# Patient Record
Sex: Male | Born: 1960 | Race: White | Hispanic: No | Marital: Married | Smoking: Former smoker
Health system: Southern US, Community
[De-identification: ages and names within clinical notes are randomized; demographics above are authoritative.]

## PROBLEM LIST (undated history)

## (undated) DIAGNOSIS — L80 Vitiligo: Secondary | ICD-10-CM

## (undated) DIAGNOSIS — M109 Gout, unspecified: Secondary | ICD-10-CM

## (undated) DIAGNOSIS — L309 Dermatitis, unspecified: Secondary | ICD-10-CM

## (undated) DIAGNOSIS — K5792 Diverticulitis of intestine, part unspecified, without perforation or abscess without bleeding: Secondary | ICD-10-CM

## (undated) DIAGNOSIS — I10 Essential (primary) hypertension: Secondary | ICD-10-CM

## (undated) HISTORY — DX: Diverticulitis of intestine, part unspecified, without perforation or abscess without bleeding: K57.92

## (undated) HISTORY — DX: Dermatitis, unspecified: L30.9

## (undated) HISTORY — DX: Vitiligo: L80

## (undated) HISTORY — DX: Gout, unspecified: M10.9

---

## 2019-06-25 ENCOUNTER — Ambulatory Visit: Payer: Self-pay | Admitting: Family Medicine

## 2019-07-02 ENCOUNTER — Ambulatory Visit (INDEPENDENT_AMBULATORY_CARE_PROVIDER_SITE_OTHER): Payer: 59 | Admitting: Family Medicine

## 2019-07-02 ENCOUNTER — Other Ambulatory Visit: Payer: Self-pay

## 2019-07-02 ENCOUNTER — Other Ambulatory Visit: Payer: Self-pay | Admitting: Family Medicine

## 2019-07-02 ENCOUNTER — Encounter: Payer: Self-pay | Admitting: Family Medicine

## 2019-07-02 VITALS — BP 122/77 | HR 55 | Temp 97.7°F | Ht 72.0 in | Wt 174.6 lb

## 2019-07-02 DIAGNOSIS — E782 Mixed hyperlipidemia: Secondary | ICD-10-CM

## 2019-07-02 DIAGNOSIS — I10 Essential (primary) hypertension: Secondary | ICD-10-CM

## 2019-07-02 DIAGNOSIS — R0789 Other chest pain: Secondary | ICD-10-CM | POA: Diagnosis not present

## 2019-07-02 DIAGNOSIS — K219 Gastro-esophageal reflux disease without esophagitis: Secondary | ICD-10-CM | POA: Diagnosis not present

## 2019-07-02 DIAGNOSIS — Z8249 Family history of ischemic heart disease and other diseases of the circulatory system: Secondary | ICD-10-CM

## 2019-07-02 DIAGNOSIS — L309 Dermatitis, unspecified: Secondary | ICD-10-CM | POA: Insufficient documentation

## 2019-07-02 DIAGNOSIS — Z8739 Personal history of other diseases of the musculoskeletal system and connective tissue: Secondary | ICD-10-CM | POA: Diagnosis not present

## 2019-07-02 DIAGNOSIS — N529 Male erectile dysfunction, unspecified: Secondary | ICD-10-CM | POA: Insufficient documentation

## 2019-07-02 DIAGNOSIS — Z8719 Personal history of other diseases of the digestive system: Secondary | ICD-10-CM

## 2019-07-02 DIAGNOSIS — Z0000 Encounter for general adult medical examination without abnormal findings: Secondary | ICD-10-CM

## 2019-07-02 DIAGNOSIS — Z1159 Encounter for screening for other viral diseases: Secondary | ICD-10-CM

## 2019-07-02 DIAGNOSIS — Z86010 Personal history of colonic polyps: Secondary | ICD-10-CM

## 2019-07-02 DIAGNOSIS — E7800 Pure hypercholesterolemia, unspecified: Secondary | ICD-10-CM | POA: Insufficient documentation

## 2019-07-02 NOTE — Patient Instructions (Addendum)
Thank you for coming to the office today.  Voltaren OTC topical anti inflammatory medicine, can do a general rx diclofenac rx in future.  Keep track of your activity level - and functional status with positioning.  Advanced Lipid Panel w/ Inflammatory Markers  Bring copy of last colonoscopy report.  DUE for FASTING BLOOD WORK (no food or drink after midnight before the lab appointment, only water or coffee without cream/sugar on the morning of)  SCHEDULE "Lab Only" visit in the morning at the clinic for lab draw in 4 WEEKS   - Make sure Lab Only appointment is at about 1 week before your next appointment, so that results will be available  For Lab Results, once available within 2-3 days of blood draw, you can can log in to MyChart online to view your results and a brief explanation. Also, we can discuss results at next follow-up visit.   Please schedule a Follow-up Appointment to: Return in about 4 weeks (around 07/30/2019) for Annual Physical.  If you have any other questions or concerns, please feel free to call the office or send a message through [place]. You may also schedule an earlier appointment if necessary.  Additionally, you may be receiving a survey about your experience at our office within a few days to 1 week by e-mail or mail. We value your feedback.  [name], DO [place]

## 2019-07-02 NOTE — Progress Notes (Addendum)
Subjective:    Patient ID: Lucas Lopez, male    DOB: 03-Sep-1960, 59 y.o.   MRN: [id number]  Yamen Haith is a 59 y.o. male presenting on 07/02/2019 for [place] (Joint pain)  Moved from [place], [place] to [place]. Previously in [place], then moved to [place]. Now has returned back for past 2 weeks to [place].  HPI    CHRONIC HTN: Reports BP readings elevated in past, he has been keeping track of home BP readings previously, does not have detailed report today. He was on diuretic in past from prior PCP had urinary frequency issues then switched to Bystolic has done well with BP control. Also he has low pulse with exercising history of a lot of cardio exercise 40s-50s range or higher at times. He says some side effects on Bystolic assoc with decreased libido erectile dysfunction, asking about adjust med in future.  Current Meds - Bystolic 15mg  (5mg  x 3 = 15)   Reports good compliance, took meds today. Tolerating well, w/o complaints. 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 Plans to retire in 2021.Building house in [place] now to life with his wife.  Suspected Costochondritis / Ribcage vs Chest Wall Pain, Chronic Now improving. But describes chronic history of chest wall rib discomfort, seemed to be affected by running / physical activity, but he did not describe exertional chest pain or pressure, or dyspnea or other concerning features. - Prior history of cardiac work up with EKG, Exercise Stress Test in past that was negative, in [place]. - Currently since moving back to [place] and changes in routine he has not had as much pain - He has tried NSAIDs in past with some relief - Currently not using med  Hyperlipidemia Prior results elevated cholesterol  Tried statin but muscle side effects, and will defer this for now, but interested to discuss further for cardiovascular protection if indicated. - [place] fam history of Hyperlipidemia, heart disease  ED Was on Sildenafil > switched with Tadalafil, thought it could cause the heartburn  Additional history Functional GI symptoms - Never had normal bowels by his report, but improved on probiotic  Mother fam history massive MI age 68 Father's fam history affecting him about 10 yr earlier  Checked into supplements, temporarily held them Vitamin D 2,000 unit dailys Re introduced Vitamin C Probiotic daily . History of Arthritis knees - improved on glucosamine, but stopped this unsure if affecting gastritis / GERD   Health Maintenance:  Last Colonoscopy 07/2016 - Has copy, pre-cancerous few polyps removed, advised 5 year repeat, will bring copy - Had EGD - had heartburn ulcer > for past 6 months - had prior  - had x 2 h pylori test negative (blood and breath) - was on Omeprazole 40mg  daily, down to 20mg , able to limit, then restarted work and symptoms returned with stress w work.  He has upcoming EGD per [facility].   Depression screen PHQ 2/9 07/02/2019  Decreased Interest 0  Down, Depressed, Hopeless 0  PHQ - 2 Score 0    Past Medical History:  Diagnosis Date  . Diverticulitis   . Eczema   . Gout    In the left most 3 toes of left foot  . Vitiligo    History reviewed. No pertinent surgical history.  Social History   Socioeconomic History  . Marital status: Married    Spouse name: Toris Milillo  . Number of children: Not on file  . Years of education: Not on file  . Highest education level: Not on file  Occupational History  . Occupation: Scientist, forensic    Comment: (retiring in Summer 2021)  Tobacco Use  . Smoking status: Former Research scientist (life sciences)  . Smokeless tobacco: Current User  Substance and Sexual Activity  . Alcohol use: Not Currently  . Drug use: Not Currently    Types:  Marijuana  . Sexual activity: Not on file  Other Topics Concern  . Not on file  Social History Narrative  . Not on file   Social Determinants of Health   Financial Resource Strain:   . Difficulty of Paying Living Expenses:   Food Insecurity:   . Worried About Charity fundraiser in the Last Year:   . Arboriculturist in the Last Year:   Transportation Needs:   . Film/video editor (Medical):   Marland Kitchen Lack of Transportation (Non-Medical):   Physical Activity:   . Days of Exercise per Week:   . Minutes of Exercise per Session:   Stress:   . Feeling of Stress :   Social Connections:   . Frequency of Communication with Friends and Family:   . Frequency of Social Gatherings with Friends and Family:   . Attends Religious Services:   . Active Member of Clubs or Organizations:   . Attends Archivist Meetings:   Marland Kitchen Marital Status:   Intimate Partner Violence:   . Fear of Current or Ex-Partner:   . Emotionally Abused:   Marland Kitchen Physically Abused:   . Sexually Abused:    Family History  Problem Relation Age of Onset  . Hypertension Mother   . Heart attack Mother 75  . Fibroids Mother   . Pneumonia Father 65  . Hyperlipidemia Father   . Hypertension Father   . Osteoarthritis Father   . Psoriasis Sister   . Skin cancer Sister    Current Outpatient Medications on File Prior to Visit  Medication Sig  . ascorbic acid (VITAMIN C) 500 MG tablet Take 500 mg by mouth daily.  Marland Kitchen BYSTOLIC 5 MG tablet 15 mg daily.   . Cholecalciferol (VITAMIN D) 50 MCG (2000 UT) CAPS Take 2,000 Units by mouth daily.  . clotrimazole (MYCELEX) 10 MG troche Take 10 mg by mouth as needed.  . clotrimazole-betamethasone (LOTRISONE) cream Apply 1 application topically 2 (two) times daily.  . cromolyn (NASALCROM) 5.2 MG/ACT nasal spray Place 1 spray into both nostrils daily as needed.  Marland Kitchen omeprazole (PRILOSEC) 20 MG capsule Take 20 mg by mouth daily.  . sildenafil (REVATIO) 20 MG tablet Take 60 mg by mouth as  needed.   No current facility-administered medications on file prior to visit.    Review of Systems Per HPI unless specifically indicated above      Objective:    BP 122/77   Pulse (!) 55   Temp 97.7 F (36.5 C)   Ht 6' (1.829 m)   Wt 174 lb 9.6 oz (79.2 kg)   SpO2 99%   BMI 23.68 kg/m   Wt Readings from Last 3 Encounters:  07/02/19 174 lb 9.6 oz (79.2 kg)    Physical Exam Vitals and nursing note reviewed.  Constitutional:      General: He is not in acute distress.    Appearance: He is well-developed. He is  not diaphoretic.     Comments: Well-appearing, comfortable, cooperative  HENT:     Head: Normocephalic and atraumatic.  Eyes:     General:        Right eye: No discharge.        Left eye: No discharge.     Conjunctiva/sclera: Conjunctivae normal.  Neck:     Thyroid: No thyromegaly.  Cardiovascular:     Rate and Rhythm: Normal rate and regular rhythm.     Heart sounds: Normal heart sounds. No murmur.  Pulmonary:     Effort: Pulmonary effort is normal. No respiratory distress.     Breath sounds: Normal breath sounds. No wheezing or rales.  Chest:     Chest wall: Tenderness (reproducible on moderate palpation bilateral anterior chest wall over ribs R and L side, not over sternum. Also has protrusion deformity of xiphoid process area of lower sternum) present.  Musculoskeletal:        General: Normal range of motion.     Cervical back: Normal range of motion and neck supple.  Lymphadenopathy:     Cervical: No cervical adenopathy.  Skin:    General: Skin is warm and dry.     Findings: No erythema or rash.  Neurological:     Mental Status: He is alert and oriented to person, place, and time.  Psychiatric:        Behavior: Behavior normal.     Comments: Well groomed, good eye contact, normal speech and thoughts    No results found for this or any previous visit.    Assessment & Plan:   Problem List Items Addressed This Visit    Mixed hyperlipidemia    Relevant Medications   BYSTOLIC 5 MG tablet   sildenafil (REVATIO) 20 MG tablet   History of gout   History of diverticulosis   History of colon polyps   Gastroesophageal reflux disease   Relevant Medications   omeprazole (PRILOSEC) 20 MG capsule   Essential hypertension - Primary   Relevant Medications   BYSTOLIC 5 MG tablet   sildenafil (REVATIO) 20 MG tablet   Erectile dysfunction   Eczema   Costochondral pain     Review background history outside records Request copy of last colonoscopy 2018  #Hyperlipidemia No recent lab Off statin, previously on with myalgia Fam history of HLD and heart disease WIll check Adv Lipid panel w inflammation from [facility] Reconsider statin options, dose adjust  #HTN Controlled on Bystolic 15mg  daily (5mg  x3) Consider other options, avoid thiazide since intolerance on diuretic Review lab results to help determine next BP med option if indicated  #ED 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  No orders of the defined types were placed in this encounter.   Follow up plan: Return in about 4 weeks (around 07/30/2019) for Annual Physical.  Future labs order CMET CBC A1c ADV LIPID PANEL, PSA TSH HEP C - 07/14/19 FUTURE EKG  [name], DO [facility] 07/02/2019, 10:40 AM

## 2019-07-07 LAB — HM COLONOSCOPY

## 2019-07-14 ENCOUNTER — Other Ambulatory Visit: Payer: 59

## 2019-07-14 ENCOUNTER — Other Ambulatory Visit: Payer: Self-pay

## 2019-07-14 DIAGNOSIS — I10 Essential (primary) hypertension: Secondary | ICD-10-CM

## 2019-07-14 DIAGNOSIS — Z0000 Encounter for general adult medical examination without abnormal findings: Secondary | ICD-10-CM

## 2019-07-14 DIAGNOSIS — Z1159 Encounter for screening for other viral diseases: Secondary | ICD-10-CM

## 2019-07-14 DIAGNOSIS — E782 Mixed hyperlipidemia: Secondary | ICD-10-CM

## 2019-07-14 DIAGNOSIS — Z8249 Family history of ischemic heart disease and other diseases of the circulatory system: Secondary | ICD-10-CM

## 2019-07-14 MED ORDER — BYSTOLIC 5 MG PO TABS: 15.0000 mg | ORAL_TABLET | Freq: Every day | ORAL | 1 refills | Status: DC

## 2019-07-15 ENCOUNTER — Encounter: Payer: Self-pay | Admitting: Family Medicine

## 2019-07-15 DIAGNOSIS — Z86010 Personal history of colonic polyps: Secondary | ICD-10-CM

## 2019-07-25 LAB — CBC WITH DIFFERENTIAL/PLATELET
Absolute Monocytes: 419 cells/uL (ref 200–950)
Basophils Absolute: 28 cells/uL (ref 0–200)
Basophils Relative: 0.6 %
Eosinophils Absolute: 60 cells/uL (ref 15–500)
Eosinophils Relative: 1.3 %
HCT: 44.5 % (ref 38.5–50.0)
Hemoglobin: 14.7 g/dL (ref 13.2–17.1)
Lymphs Abs: 1339 cells/uL (ref 850–3900)
MCH: 31.7 pg (ref 27.0–33.0)
MCHC: 33 g/dL (ref 32.0–36.0)
MCV: 96.1 fL (ref 80.0–100.0)
MPV: 11.1 fL (ref 7.5–12.5)
Monocytes Relative: 9.1 %
Neutro Abs: 2755 cells/uL (ref 1500–7800)
Neutrophils Relative %: 59.9 %
Platelets: 174 10*3/uL (ref 140–400)
RBC: 4.63 10*6/uL (ref 4.20–5.80)
RDW: 12.6 % (ref 11.0–15.0)
Total Lymphocyte: 29.1 %
WBC: 4.6 10*3/uL (ref 3.8–10.8)

## 2019-07-25 LAB — PSA: PSA: 1.1 ng/mL (ref <=4.0)

## 2019-07-25 LAB — COMPLETE METABOLIC PANEL WITH GFR
AG Ratio: 2 (calc) (ref 1.0–2.5)
ALT: 19 U/L (ref 9–46)
AST: 23 U/L (ref 10–35)
Albumin: 4.3 g/dL (ref 3.6–5.1)
Alkaline phosphatase (APISO): 49 U/L (ref 35–144)
BUN: 15 mg/dL (ref 7–25)
CO2: 26 mmol/L (ref 20–32)
Calcium: 9.3 mg/dL (ref 8.6–10.3)
Chloride: 102 mmol/L (ref 98–110)
Creat: 0.96 mg/dL (ref 0.70–1.33)
GFR, Est African American: 101 mL/min/{1.73_m2} (ref >=60)
GFR, Est Non African American: 87 mL/min/{1.73_m2} (ref >=60)
Globulin: 2.2 g/dL (calc) (ref 1.9–3.7)
Glucose, Bld: 99 mg/dL (ref 65–99)
Potassium: 4.4 mmol/L (ref 3.5–5.3)
Sodium: 136 mmol/L (ref 135–146)
Total Bilirubin: 0.7 mg/dL (ref 0.2–1.2)
Total Protein: 6.5 g/dL (ref 6.1–8.1)

## 2019-07-25 LAB — HEPATITIS C ANTIBODY
Hepatitis C Ab: NONREACTIVE
SIGNAL TO CUT-OFF: 0.01 (ref <1.00)

## 2019-07-25 LAB — CARDIO IQ ADV LIPID AND INFLAMM PNL
Apolipoprotein B: 97 mg/dL — ABNORMAL HIGH
Cholesterol: 207 mg/dL — ABNORMAL HIGH (ref <200)
HDL: 60 mg/dL (ref >=40)
LDL Cholesterol (Calc): 126 mg/dL (calc) — ABNORMAL HIGH (ref <100)
LDL Large: 4553 nmol/L — ABNORMAL LOW (ref >6729)
LDL Medium: 254 nmol/L — ABNORMAL HIGH (ref <215)
LDL Particle Number: 1038 nmol/L (ref <1138)
LDL Peak Size: 221.7 Angstrom — ABNORMAL LOW (ref >222.9)
LDL Small: 143 nmol/L — ABNORMAL HIGH (ref <142)
Lipoprotein (a): 12 nmol/L (ref <75)
Non-HDL Cholesterol (Calc): 147 mg/dL (calc) — ABNORMAL HIGH (ref <130)
PLAC: 105 nmol/min/mL (ref <=123)
Total CHOL/HDL Ratio: 3.5 calc (ref <5.0)
Triglycerides: 107 mg/dL (ref <150)
hs-CRP: 2 mg/L

## 2019-07-25 LAB — HEMOGLOBIN A1C
Hgb A1c MFr Bld: 5.5 % of total Hgb (ref <5.7)
Mean Plasma Glucose: 111 (calc)
eAG (mmol/L): 6.2 (calc)

## 2019-07-25 LAB — TSH: TSH: 2.05 mIU/L (ref 0.40–4.50)

## 2019-08-05 ENCOUNTER — Encounter: Payer: Self-pay | Admitting: Family Medicine

## 2019-08-05 ENCOUNTER — Other Ambulatory Visit: Payer: Self-pay | Admitting: Family Medicine

## 2019-08-05 ENCOUNTER — Ambulatory Visit (INDEPENDENT_AMBULATORY_CARE_PROVIDER_SITE_OTHER): Payer: 59 | Admitting: Family Medicine

## 2019-08-05 ENCOUNTER — Other Ambulatory Visit: Payer: Self-pay

## 2019-08-05 VITALS — BP 127/82 | HR 50 | Temp 98.0°F | Ht 72.0 in | Wt 173.0 lb

## 2019-08-05 DIAGNOSIS — K2289 Other specified disease of esophagus: Secondary | ICD-10-CM

## 2019-08-05 DIAGNOSIS — E7800 Pure hypercholesterolemia, unspecified: Secondary | ICD-10-CM

## 2019-08-05 DIAGNOSIS — K219 Gastro-esophageal reflux disease without esophagitis: Secondary | ICD-10-CM | POA: Diagnosis not present

## 2019-08-05 DIAGNOSIS — I10 Essential (primary) hypertension: Secondary | ICD-10-CM

## 2019-08-05 DIAGNOSIS — Z0000 Encounter for general adult medical examination without abnormal findings: Secondary | ICD-10-CM | POA: Diagnosis not present

## 2019-08-05 DIAGNOSIS — K229 Disease of esophagus, unspecified: Secondary | ICD-10-CM | POA: Diagnosis not present

## 2019-08-05 LAB — POCT URINALYSIS DIPSTICK
Bilirubin, UA: NEGATIVE
Blood, UA: NEGATIVE
Glucose, UA: NEGATIVE
Ketones, UA: NEGATIVE
Leukocytes, UA: NEGATIVE
Nitrite, UA: NEGATIVE
Protein, UA: POSITIVE — AB
Spec Grav, UA: 1.01 (ref 1.010–1.025)
Urobilinogen, UA: 0.2 E.U./dL
pH, UA: 5 (ref 5.0–8.0)

## 2019-08-05 MED ORDER — OMEPRAZOLE 20 MG PO CPDR: 20.0000 mg | DELAYED_RELEASE_CAPSULE | Freq: Every day | ORAL | 1 refills | Status: DC

## 2019-08-05 MED ORDER — ROSUVASTATIN CALCIUM 5 MG PO TABS: 5.0000 mg | ORAL_TABLET | Freq: Every day | ORAL | 3 refills | Status: DC

## 2019-08-05 NOTE — Assessment & Plan Note (Signed)
Mild elevated LDL 126, adv lipid panel reviewed, mostly optimal to moderate risk, low inflammation Last lipid panel 07/2019 Calculated ASCVD 10 yr risk score 7.5%, has fam history MI age 72s  Plan: 1. START Rosuvastatin 5mg  nightly - review benefit risk primary ASCVD risk reduction, and lower lipids, review side effects possible and titration plan if side effect 2 Encourage improved lifestyle - low carb/cholesterol, reduce portion size, continue improving regular exercise  Check Lipid and CMET in 3 months, may do virtual to review results, otherwise yearly

## 2019-08-05 NOTE — Patient Instructions (Addendum)
Thank you for coming to the office today.  Go ahead and take the Omeprazole 20mg  daily before breakfast for period of time can do shorter interval if need 2-6 weeks can taper off to avoid rebound. In future if need longer, may use for several months 3-6 months at a time, we can be vigilant and hold off on this med in future and see how you respond off of it, and compare.  You are at increased risk of future Cardiovascular complications such as Heart Attack or Stroke from an artery blockage due to abnormal cholesterol and/or risk factors. - As discussed, Statin Cholesterol pills both can both LOWER cholesterol and REDUCE this future risk of heart attack and stroke - Start Rosuvastatin (generic Crestor) 5mg  pill once at bedtime every night  If you develop mild aches or pains in muscle or joint that does NOT improve or go away after first 3-4 weeks then this may require [place] to adjust the dose. First I would recommend STOPPING the medication for a few weeks until your ache and pain symptoms completely RESOLVE. Then you can restart at a LOWER DOSE either HALF a pill at bedtime every night or LESS OFTEN such as one pill a week only and then gradually increase to every other day or max dose of 3 times a week  Lastly, sometimes we need to try other versions of this medicine to find one that works for you and does not cause side effects.  - Possible Morton's Neuroma of foot, or other nerve compression.  DUE for FASTING BLOOD WORK (no food or drink after midnight before the lab appointment, only water or coffee without cream/sugar on the morning of)  SCHEDULE "Lab Only" visit in the morning at the clinic for lab draw in 3 MONTHS   - Make sure Lab Only appointment is at about 1 week before your next appointment, so that results will be available  For Lab Results, once available within 2-3 days of blood draw, you can can log in to MyChart online to view your results and a brief explanation. Also, we can  discuss results at next follow-up visit.    Please schedule a Follow-up Appointment to: Return in about 3 months (around 11/04/2019) for fasting lab only then can do virtual for results if prefer (Cholesterol).  If you have any other questions or concerns, please feel free to call the office or send a message through [facility] [place]. You may also schedule an earlier appointment if necessary.  Additionally, you may be receiving a survey about your experience at our office within a few days to 1 week by e-mail or mail. We value your feedback.  [name], DO [place]

## 2019-08-05 NOTE — Progress Notes (Signed)
Subjective:    Patient ID: Lucas Lopez, male    DOB: 1960/07/27, 59 y.o.   MRN: [id number]  Lucas Lopez is a 59 y.o. male presenting on 08/05/2019 for Annual Exam   HPI   Here Annual Physical and Lab Review.  Lifestyle / Hyperlipidemia / BMI >23 Overal he is doing well, balanced diet and regular exercise Labs showed A1c 5.5, he has maternal fam history T2DM age 75-60s Prior results elevated cholesterol. Lab panel now from 07/2019 showed LDL 126, total chol 207, and adv lipid panel results as well, mostly with optimal or moderate only risk, does have low small LDL particle size which is optimal but has reduced Large LDL which is moderate risk. He has low inflammatory markers. - Past history tried statin but muscle side effects unsure which one or which dose, he is willing to try again - Not on ASA - He is interested in cardiovascular prevention - [place] fam history of Hyperlipidemia, heart disease, mother massive MI age 64  Hypertension No new problem. BP controlled Taking Bystolic 15mg  daily 5mg  x 3  Additional updates Last visit discussed costchondritis or ribcage pain, and this has improved overall as he has worked on exercise regimen. Mostly resolved at this time.  Admits - Loose bowel, hemorrhoid. He takes magnesium for muscle cramps, has looser stool. Takes probiotic. Has not tried fiber supplement  GERD, without gastritis has upper GI, heartburn symptoms, was on PPI previously Followed with [facility], had EGD recently, no significant abnormality, no gastritis, only some abnormal change of cells in esophagus as described. He is asking about dosing of PPI, GI recommended to take it longer  Chronic trace proteinuria Today U/A trace protein, long term history of trace protein in urine. Never had further testing.  Health Maintenance:  Prostate CA Screening: Prior PSA / DRE reported normal. Last PSA 1.1 (07/2019). Currently asymptomatic. No known family history of prostate  CA.   Colon CA Screening: Last Colonoscopy 07/07/19 (done by [facility]), results with 1 pre-cancerous polyp benign, and internal hemorrhoid, diverticula, repeat in 5 years recommended scanned to chart. No known family history of colon CA.   Depression screen [facility] 2/9 08/05/2019 07/02/2019  Decreased Interest 0 0  Down, Depressed, Hopeless 0 0  PHQ - 2 Score 0 0    Past Medical History:  Diagnosis Date  . Diverticulitis   . Eczema   . Gout    In the left most 3 toes of left foot  . Vitiligo    History reviewed. No pertinent surgical history. Social History   Socioeconomic History  . Marital status: Married    Spouse name: Koda Quier  . Number of children: Not on file  . Years of education: Not on file  . Highest education level: Not on file  Occupational History  . Occupation: Scientist, forensic    Comment: (retiring in Summer 2021)  Tobacco Use  . Smoking status: Former Research scientist (life sciences)  . Smokeless tobacco: Former Network engineer and Sexual Activity  . Alcohol use: Not Currently  . Drug use: Not Currently    Types: Marijuana  . Sexual activity: Not on file  Other Topics Concern  . Not on file  Social History Narrative  . Not on file   Social Determinants of Health   Financial Resource Strain:   . Difficulty of Paying Living Expenses:   Food Insecurity:   . Worried About Charity fundraiser in the Last Year:   . [employer] of  Food in the Last Year:   Transportation Needs:   . Film/video editor (Medical):   Marland Kitchen Lack of Transportation (Non-Medical):   Physical Activity:   . Days of Exercise per Week:   . Minutes of Exercise per Session:   Stress:   . Feeling of Stress :   Social Connections:   . Frequency of Communication with Friends and Family:   . Frequency of Social Gatherings with Friends and Family:   . Attends Religious Services:   . Active Member of Clubs or Organizations:   . Attends Archivist Meetings:   Marland Kitchen Marital Status:   Intimate Partner  Violence:   . Fear of Current or Ex-Partner:   . Emotionally Abused:   Marland Kitchen Physically Abused:   . Sexually Abused:    Family History  Problem Relation Age of Onset  . Hypertension Mother   . Heart attack Mother 13  . Fibroids Mother   . Pneumonia Father 28  . Hyperlipidemia Father   . Hypertension Father   . Osteoarthritis Father   . Psoriasis Sister   . Skin cancer Sister    Current Outpatient Medications on File Prior to Visit  Medication Sig  . BYSTOLIC 5 MG tablet Take 3 tablets (15 mg total) by mouth daily.  . Cholecalciferol (VITAMIN D) 50 MCG (2000 UT) CAPS Take 2,000 Units by mouth daily.  . clotrimazole-betamethasone (LOTRISONE) cream Apply 1 application topically 2 (two) times daily.  . cromolyn (NASALCROM) 5.2 MG/ACT nasal spray Place 1 spray into both nostrils daily as needed.  . magnesium oxide (MAG-OX) 400 MG tablet Take 400 mg by mouth daily.  . sildenafil (REVATIO) 20 MG tablet Take 60 mg by mouth as needed.  Marland Kitchen ascorbic acid (VITAMIN C) 500 MG tablet Take 500 mg by mouth daily.   No current facility-administered medications on file prior to visit.    Review of Systems  Constitutional: Negative for activity change, appetite change, chills, diaphoresis, fatigue and fever.  HENT: Negative for congestion and hearing loss.   Eyes: Negative for visual disturbance.  Respiratory: Negative for apnea, cough, chest tightness, shortness of breath and wheezing.   Cardiovascular: Negative for chest pain, palpitations and leg swelling.  Gastrointestinal: Negative for abdominal pain, anal bleeding, blood in stool, constipation, diarrhea, nausea and vomiting.  Endocrine: Negative for cold intolerance.  Genitourinary: Negative for difficulty urinating, dysuria, frequency and hematuria.  Musculoskeletal: Negative for arthralgias, back pain and neck pain.  Skin: Negative for rash.  Allergic/Immunologic: Negative for environmental allergies.  Neurological: Negative for dizziness,  weakness, light-headedness, numbness and headaches.  Hematological: Negative for adenopathy.  Psychiatric/Behavioral: Negative for behavioral problems, dysphoric mood and sleep disturbance. The patient is not nervous/anxious.    Per HPI unless specifically indicated above      Objective:    BP 127/82   Pulse (!) 50   Temp 98 F (36.7 C) (Temporal)   Ht 6' (1.829 m)   Wt 173 lb (78.5 kg)   SpO2 99%   BMI 23.46 kg/m   Wt Readings from Last 3 Encounters:  08/05/19 173 lb (78.5 kg)  07/02/19 174 lb 9.6 oz (79.2 kg)    Physical Exam Vitals and nursing note reviewed.  Constitutional:      General: He is not in acute distress.    Appearance: He is well-developed. He is not diaphoretic.     Comments: Well-appearing, comfortable, cooperative  HENT:     Head: Normocephalic and atraumatic.  Eyes:  General:        Right eye: No discharge.        Left eye: No discharge.     Conjunctiva/sclera: Conjunctivae normal.     Pupils: Pupils are equal, round, and reactive to light.  Neck:     Thyroid: No thyromegaly.  Cardiovascular:     Rate and Rhythm: Normal rate and regular rhythm.     Heart sounds: Normal heart sounds. No murmur.  Pulmonary:     Effort: Pulmonary effort is normal. No respiratory distress.     Breath sounds: Normal breath sounds. No wheezing or rales.  Abdominal:     General: Bowel sounds are normal. There is no distension.     Palpations: Abdomen is soft. There is no mass.     Tenderness: There is no abdominal tenderness.  Musculoskeletal:        General: No tenderness. Normal range of motion.     Cervical back: Normal range of motion and neck supple.     Comments: Upper / Lower Extremities: - Normal muscle tone, strength bilateral upper extremities 5/5, lower extremities 5/5  Lymphadenopathy:     Cervical: No cervical adenopathy.  Skin:    General: Skin is warm and dry.     Findings: No erythema or rash.  Neurological:     Mental Status: He is alert and  oriented to person, place, and time.     Comments: Distal sensation intact to light touch all extremities  Psychiatric:        Behavior: Behavior normal.     Comments: Well groomed, good eye contact, normal speech and thoughts    EKG - performed in office today  Date: 08/05/19  Rate: 48 HR   Rhythm: sinus bradycardia  QRS Axis: normal  Intervals: normal  ST/T Wave abnormalities: normal  Conduction Disutrbances:none  Additional Narrative Interpretation:   Old EKG Reviewed: none available  Lipid Panel     Component Value Date/Time   CHOL 207 (H) 07/14/2019 0757   TRIG 107 07/14/2019 0757   HDL 60 07/14/2019 0757   CHOLHDL 3.5 07/14/2019 0757   LDLCALC 126 (H) 07/14/2019 0757     Chemistry      Component Value Date/Time   NA 136 07/14/2019 0757   K 4.4 07/14/2019 0757   CL 102 07/14/2019 0757   CO2 26 07/14/2019 0757   BUN 15 07/14/2019 0757   CREATININE 0.96 07/14/2019 0757      Component Value Date/Time   CALCIUM 9.3 07/14/2019 0757   AST 23 07/14/2019 0757   ALT 19 07/14/2019 0757   BILITOT 0.7 07/14/2019 0757     Recent Labs    07/14/19 0757  HGBA1C 5.5   CBC:    Component Value Date/Time   WBC 4.6 07/14/2019 0757   HGB 14.7 07/14/2019 0757   HCT 44.5 07/14/2019 0757   PLT 174 07/14/2019 0757   MCV 96.1 07/14/2019 0757   NEUTROABS 2,755 07/14/2019 0757   LYMPHSABS 1,339 07/14/2019 0757   EOSABS 60 07/14/2019 0757   BASOSABS 28 07/14/2019 0757       Results for orders placed or performed in visit on 08/05/19  POCT Urinalysis Dipstick  Result Value Ref Range   Color, UA yellow    Clarity, UA clear    Glucose, UA Negative Negative   Bilirubin, UA negative    Ketones, UA negative    Spec Grav, UA 1.010 1.010 - 1.025   Blood, UA negative    pH, UA  5.0 5.0 - 8.0   Protein, UA Positive (A) Negative   Urobilinogen, UA 0.2 0.2 or 1.0 E.U./dL   Nitrite, UA negative    Leukocytes, UA Negative Negative   Appearance clear    Odor         Assessment & Plan:   Problem List Items Addressed This Visit    Pure hypercholesterolemia    Mild elevated LDL 126, adv lipid panel reviewed, mostly optimal to moderate risk, low inflammation Last lipid panel 07/2019 Calculated ASCVD 10 yr risk score 7.5%, has fam history MI age 4s  Plan: 1. START Rosuvastatin 5mg  nightly - review benefit risk primary ASCVD risk reduction, and lower lipids, review side effects possible and titration plan if side effect 2 Encourage improved lifestyle - low carb/cholesterol, reduce portion size, continue improving regular exercise  Check Lipid and CMET in 3 months, may do virtual to review results, otherwise yearly      Relevant Medications   rosuvastatin (CRESTOR) 5 MG tablet   Mucosal abnormality of esophagus  No significant concern Identified on EGD per [facility] Agree with trial on PPI for his GERD symptoms, can monitor this in several years to determine if significant   Relevant Medications   omeprazole (PRILOSEC) 20 MG capsule   Gastroesophageal reflux disease  Trial PPI Omeprazole 20mg  daily before breakfast 2-6 week intervals can do longer if need, review safety, goals and dosing titration    Relevant Medications   magnesium oxide (MAG-OX) 400 MG tablet   omeprazole (PRILOSEC) 20 MG capsule   Essential hypertension    Well-controlled HTN - Home BP readings normal  No known complications   Plan:  1. Continue current BP regimen - Bystolic 5mg  x 3 = 15mg  2. Encourage improved lifestyle - low sodium diet, regular exercise 3. Continue monitor BP outside office, bring readings to next visit, if persistently >140/90 or new symptoms notify office sooner      Relevant Medications   rosuvastatin (CRESTOR) 5 MG tablet    Other Visit Diagnoses    Annual physical exam    -  Primary   Relevant Orders   EKG 12-Lead   POCT Urinalysis Dipstick (Completed)       Updated Health Maintenance information Reviewed recent lab results with  patient - EKG performed, unremarkable, reassuring, can monitor surveillance in future - UA performed negative except trace protein, discussed, normal Kidney function and lab chemistry unlikely to be a concern at this time can do further evaluation in future if indicated. This is a chronic problem reportedly present for 30+ years. Encouraged improvement to lifestyle with diet and exercise Maintain healthy weight  Meds ordered this encounter  Medications  . omeprazole (PRILOSEC) 20 MG capsule    Sig: Take 1 capsule (20 mg total) by mouth daily before breakfast.    Dispense:  90 capsule    Refill:  1  . rosuvastatin (CRESTOR) 5 MG tablet    Sig: Take 1 tablet (5 mg total) by mouth at bedtime.    Dispense:  90 tablet    Refill:  3    Follow up plan: Return in about 3 months (around 11/04/2019) for fasting lab only then can do virtual for results if prefer (Cholesterol).    [name], [facility] 08/05/2019, 9:36 AM

## 2019-08-05 NOTE — Assessment & Plan Note (Signed)
Well-controlled HTN - Home BP readings normal  No known complications   Plan:  1. Continue current BP regimen - Bystolic 5mg  x 3 = 15mg  2. Encourage improved lifestyle - low sodium diet, regular exercise 3. Continue monitor BP outside office, bring readings to next visit, if persistently >140/90 or new symptoms notify office sooner

## 2019-09-29 DIAGNOSIS — N529 Male erectile dysfunction, unspecified: Secondary | ICD-10-CM

## 2019-09-30 MED ORDER — TADALAFIL 5 MG PO TABS: 5.0000 mg | ORAL_TABLET | Freq: Every day | ORAL | 3 refills | Status: DC

## 2019-09-30 NOTE — Addendum Note (Signed)
Addended by: Olin Hauser on: 09/30/2019 05:26 PM   Modules accepted: Orders

## 2019-10-25 ENCOUNTER — Other Ambulatory Visit: Payer: Self-pay

## 2019-10-25 DIAGNOSIS — E7800 Pure hypercholesterolemia, unspecified: Secondary | ICD-10-CM

## 2019-10-29 ENCOUNTER — Other Ambulatory Visit: Payer: Self-pay

## 2019-10-29 ENCOUNTER — Other Ambulatory Visit: Payer: 59

## 2019-10-29 LAB — COMPLETE METABOLIC PANEL WITH GFR
AG Ratio: 2.6 (calc) — ABNORMAL HIGH (ref 1.0–2.5)
ALT: 39 U/L (ref 9–46)
AST: 23 U/L (ref 10–35)
Albumin: 5 g/dL (ref 3.6–5.1)
Alkaline phosphatase (APISO): 49 U/L (ref 35–144)
BUN: 14 mg/dL (ref 7–25)
CO2: 26 mmol/L (ref 20–32)
Calcium: 9.8 mg/dL (ref 8.6–10.3)
Chloride: 103 mmol/L (ref 98–110)
Creat: 1 mg/dL (ref 0.70–1.33)
GFR, Est African American: 96 mL/min/{1.73_m2} (ref >=60)
GFR, Est Non African American: 83 mL/min/{1.73_m2} (ref >=60)
Globulin: 1.9 g/dL (calc) (ref 1.9–3.7)
Glucose, Bld: 99 mg/dL (ref 65–99)
Potassium: 4.8 mmol/L (ref 3.5–5.3)
Sodium: 136 mmol/L (ref 135–146)
Total Bilirubin: 0.8 mg/dL (ref 0.2–1.2)
Total Protein: 6.9 g/dL (ref 6.1–8.1)

## 2019-10-29 LAB — LIPID PANEL
Cholesterol: 177 mg/dL (ref <200)
HDL: 72 mg/dL (ref >=40)
LDL Cholesterol (Calc): 88 mg/dL (calc)
Non-HDL Cholesterol (Calc): 105 mg/dL (calc) (ref <130)
Total CHOL/HDL Ratio: 2.5 (calc) (ref <5.0)
Triglycerides: 84 mg/dL (ref <150)

## 2019-11-05 ENCOUNTER — Other Ambulatory Visit: Payer: Self-pay

## 2019-11-05 ENCOUNTER — Encounter: Payer: Self-pay | Admitting: Family Medicine

## 2019-11-05 ENCOUNTER — Telehealth (INDEPENDENT_AMBULATORY_CARE_PROVIDER_SITE_OTHER): Payer: 59 | Admitting: Family Medicine

## 2019-11-05 DIAGNOSIS — E7800 Pure hypercholesterolemia, unspecified: Secondary | ICD-10-CM | POA: Diagnosis not present

## 2019-11-05 NOTE — Progress Notes (Signed)
Virtual Visit via Telephone The purpose of this virtual visit is to provide medical care while limiting exposure to the novel coronavirus (COVID19) for both patient and office staff.  Consent was obtained for phone visit:  Yes.   Answered questions that patient had about telehealth interaction:  Yes.   I discussed the limitations, risks, security and privacy concerns of performing an evaluation and management service by telephone. I also discussed with the patient that there may be a patient responsible charge related to this service. The patient expressed understanding and agreed to proceed.  Patient Location: Home Provider Location: [facility])  ---------------------------------------------------------------------- Chief Complaint  Patient presents with  . Hyperlipidemia    S: Reviewed CMA documentation. I have called patient and gathered additional HPI as follows:   Hyperlipidemia - Last visit with me 07/2019, for annual physical, treated with new start statin therapy Rosuvastatin after adv lipid panel results for ASCVD risk and LDL reduction, see prior notes for background information. - Interval update with significant improvement, on statin. See lipid results below, reduced LDL by 40 patients 126 to 88 - Today patient reports doing well. No side effects of myalgia or concerns. He is taking statin nightly. - Not on ASA - Known fam history of Hyperlipidemia, heart disease, mother massive MI age 39   Denies any known or suspected exposure to person with or possibly with COVID19.  Denies any fevers, chills, sweats, body ache, cough, shortness of breath, sinus pain or pressure, headache, abdominal pain, diarrhea  Past Medical History:  Diagnosis Date  . Diverticulitis   . Eczema   . Gout    In the left most 3 toes of left foot  . Vitiligo    Social History   Tobacco Use  . Smoking status: Former Research scientist (life sciences)  . Smokeless tobacco: Former Network engineer  Use Topics  . Alcohol use: Not Currently  . Drug use: Not Currently    Types: Marijuana    Current Outpatient Medications:  .  ascorbic acid (VITAMIN C) 500 MG tablet, Take 500 mg by mouth daily., Disp: , Rfl:  .  BYSTOLIC 5 MG tablet, Take 3 tablets (15 mg total) by mouth daily., Disp: 270 tablet, Rfl: 1 .  Cholecalciferol (VITAMIN D) 50 MCG (2000 UT) CAPS, Take 2,000 Units by mouth daily., Disp: , Rfl:  .  clotrimazole-betamethasone (LOTRISONE) cream, Apply 1 application topically 2 (two) times daily., Disp: , Rfl:  .  cromolyn (NASALCROM) 5.2 MG/ACT nasal spray, Place 1 spray into both nostrils daily as needed., Disp: , Rfl:  .  magnesium oxide (MAG-OX) 400 MG tablet, Take 400 mg by mouth daily., Disp: , Rfl:  .  omeprazole (PRILOSEC) 20 MG capsule, Take 1 capsule (20 mg total) by mouth daily before breakfast., Disp: 90 capsule, Rfl: 1 .  rosuvastatin (CRESTOR) 5 MG tablet, Take 1 tablet (5 mg total) by mouth at bedtime., Disp: 90 tablet, Rfl: 3 .  sildenafil (REVATIO) 20 MG tablet, Take 60 mg by mouth as needed., Disp: , Rfl:  .  tadalafil (CIALIS) 5 MG tablet, Take 1 tablet (5 mg total) by mouth daily. (Patient not taking: Reported on 11/05/2019), Disp: 90 tablet, Rfl: 3  Depression screen [facility] 2/9 11/05/2019 08/05/2019 07/02/2019  Decreased Interest 0 0 0  Down, Depressed, Hopeless 0 0 0  PHQ - 2 Score 0 0 0    No flowsheet data found.  -------------------------------------------------------------------------- O: No physical exam performed due to remote telephone encounter.  Lab results  reviewed.  Recent Results (from the past 2160 hour(s))  Lipid panel     Status: None   Collection Time: 10/29/19  8:48 AM  Result Value Ref Range   Cholesterol 177 <200 mg/dL   HDL 72 > OR = 40 mg/dL   Triglycerides 84 <150 mg/dL   LDL Cholesterol (Calc) 88 mg/dL (calc)    Comment: Reference range: <100 . Desirable range <100 mg/dL for primary prevention;   <70 mg/dL for patients with CHD or  diabetic patients  with > or = 2 CHD risk factors. Marland Kitchen LDL-C is now calculated using the Martin-Hopkins  calculation, which is a validated novel method providing  better accuracy than the Friedewald equation in the  estimation of LDL-C.  Cresenciano Genre et al. Annamaria Helling. 4580;998(33): 2061-2068  (http://education.[employer].com/faq/FAQ164)    Total CHOL/HDL Ratio 2.5 <5.0 (calc)   Non-HDL Cholesterol (Calc) 105 <130 mg/dL (calc)    Comment: For patients with diabetes plus 1 major ASCVD risk  factor, treating to a non-HDL-C goal of <100 mg/dL  (LDL-C of <70 mg/dL) is considered a therapeutic  option.   COMPLETE METABOLIC PANEL WITH GFR     Status: Abnormal   Collection Time: 10/29/19  8:48 AM  Result Value Ref Range   Glucose, Bld 99 65 - 99 mg/dL    Comment: .            Fasting reference interval .    BUN 14 7 - 25 mg/dL   Creat 1.00 0.70 - 1.33 mg/dL    Comment: For patients >25 years of age, the reference limit for Creatinine is approximately 13% higher for people identified as African-American. .    GFR, Est Non African American 83 > OR = 60 mL/min/1.2m2   GFR, Est African American 96 > OR = 60 mL/min/1.13m2   BUN/Creatinine Ratio NOT APPLICABLE 6 - 22 (calc)   Sodium 136 135 - 146 mmol/L   Potassium 4.8 3.5 - 5.3 mmol/L   Chloride 103 98 - 110 mmol/L   CO2 26 20 - 32 mmol/L   Calcium 9.8 8.6 - 10.3 mg/dL   Total Protein 6.9 6.1 - 8.1 g/dL   Albumin 5.0 3.6 - 5.1 g/dL   Globulin 1.9 1.9 - 3.7 g/dL (calc)   AG Ratio 2.6 (H) 1.0 - 2.5 (calc)   Total Bilirubin 0.8 0.2 - 1.2 mg/dL   Alkaline phosphatase (APISO) 49 35 - 144 U/L   AST 23 10 - 35 U/L   ALT 39 9 - 46 U/L    -------------------------------------------------------------------------- A&P:  Problem List Items Addressed This Visit    Pure hypercholesterolemia - Primary    Significant improvement on new statin Reduced LDL by about 40 patients from 126 to 88 No side effect and normal LFTs, only slight elevated  ALT still normal range  Prior adv lipid panel done, see prior results Calculated ASCVD 10 yr risk score 7.5%, has fam history MI age 18s  Plan: 1. CONTINUE Rosuvastatin 5mg  nightly - review benefit risk primary ASCVD risk reduction, and lower lipids, review side effects possible and titration plan if side effect 2 Encourage improved lifestyle - low carb/cholesterol, reduce portion size, continue improving regular exercise         No orders of the defined types were placed in this encounter.   Follow-up: - Return in April 2022 for physical, will be due for UA and EKG at time of visit  He will request [facility] or other labs prior to his visit.  Patient verbalizes understanding with the above medical recommendations including the limitation of remote medical advice.  Specific follow-up and call-back criteria were given for patient to follow-up or seek medical care more urgently if needed.   - Time spent in direct consultation with patient on phone: 10 minutes   [name], [facility] 11/05/2019, 8:41 AM

## 2019-11-05 NOTE — Assessment & Plan Note (Signed)
Significant improvement on new statin Reduced LDL by about 40 patients from 126 to 88 No side effect and normal LFTs, only slight elevated ALT still normal range  Prior adv lipid panel done, see prior results Calculated ASCVD 10 yr risk score 7.5%, has fam history MI age 81s  Plan: 1. CONTINUE Rosuvastatin 5mg  nightly - review benefit risk primary ASCVD risk reduction, and lower lipids, review side effects possible and titration plan if side effect 2 Encourage improved lifestyle - low carb/cholesterol, reduce portion size, continue improving regular exercise

## 2019-11-05 NOTE — Patient Instructions (Addendum)
  Keep up the great work  Keep on Rosuvastatin 5mg  nightly, it is working great based on the lab results!  DUE for FASTING BLOOD WORK (no food or drink after midnight before the lab appointment, only water or coffee without cream/sugar on the morning of)  DUE APRIL 2022  LET ME KNOW WHICH LAB COMPANY ([place], [place], [facility]) - if you want to go to [place] if you want a stand alone [facility] office - there should be one nearby in [place].  - Make sure Lab Only appointment is at about 1 week before your next appointment, so that results will be available  For Lab Results, once available within 2-3 days of blood draw, you can can log in to MyChart online to view your results and a brief explanation. Also, we can discuss results at next follow-up visit.   Please schedule a Follow-up Appointment to: Return in about 9 months (around 08/05/2020) for Annual physical + UA, EKG.  If you have any other questions or concerns, please feel free to call the office or send a message through [place]. You may also schedule an earlier appointment if necessary.  Additionally, you may be receiving a survey about your experience at our office within a few days to 1 week by e-mail or mail. We value your feedback.  [name], DO [facility]

## 2019-12-25 DIAGNOSIS — I10 Essential (primary) hypertension: Secondary | ICD-10-CM

## 2019-12-25 DIAGNOSIS — E7800 Pure hypercholesterolemia, unspecified: Secondary | ICD-10-CM

## 2020-01-04 MED ORDER — ROSUVASTATIN CALCIUM 5 MG PO TABS: 5.0000 mg | ORAL_TABLET | Freq: Every day | ORAL | 3 refills | Status: DC

## 2020-01-04 MED ORDER — BYSTOLIC 5 MG PO TABS: 15.0000 mg | ORAL_TABLET | Freq: Every day | ORAL | 3 refills | Status: DC

## 2020-01-04 NOTE — Addendum Note (Signed)
Addended by: Olin Hauser on: 01/04/2020 12:49 PM   Modules accepted: Orders

## 2020-01-07 ENCOUNTER — Telehealth: Payer: Self-pay | Admitting: Family Medicine

## 2020-01-07 DIAGNOSIS — E7800 Pure hypercholesterolemia, unspecified: Secondary | ICD-10-CM

## 2020-01-07 DIAGNOSIS — I10 Essential (primary) hypertension: Secondary | ICD-10-CM

## 2020-01-07 NOTE — Telephone Encounter (Signed)
Refill send on 01/04/2020.

## 2020-01-10 ENCOUNTER — Encounter: Payer: Self-pay | Admitting: Family Medicine

## 2020-01-10 ENCOUNTER — Other Ambulatory Visit: Payer: Self-pay

## 2020-01-10 ENCOUNTER — Ambulatory Visit (INDEPENDENT_AMBULATORY_CARE_PROVIDER_SITE_OTHER): Payer: 59 | Admitting: Family Medicine

## 2020-01-10 VITALS — BP 122/66 | HR 64 | Temp 97.7°F | Resp 16 | Ht 72.0 in | Wt 174.0 lb

## 2020-01-10 DIAGNOSIS — Z23 Encounter for immunization: Secondary | ICD-10-CM

## 2020-01-10 DIAGNOSIS — M545 Low back pain, unspecified: Secondary | ICD-10-CM | POA: Diagnosis not present

## 2020-01-10 DIAGNOSIS — M62830 Muscle spasm of back: Secondary | ICD-10-CM | POA: Diagnosis not present

## 2020-01-10 DIAGNOSIS — M47812 Spondylosis without myelopathy or radiculopathy, cervical region: Secondary | ICD-10-CM | POA: Diagnosis not present

## 2020-01-10 MED ORDER — NAPROXEN 500 MG PO TABS: 500.0000 mg | ORAL_TABLET | Freq: Two times a day (BID) | ORAL | 0 refills | Status: DC

## 2020-01-10 MED ORDER — BACLOFEN 10 MG PO TABS: 5.0000 mg | ORAL_TABLET | Freq: Three times a day (TID) | ORAL | 0 refills | Status: DC | PRN

## 2020-01-10 NOTE — Patient Instructions (Addendum)
Thank you for coming to the office today.  1. For your Back Pain - I think that this is due to Muscle Spasms or strain. 2. Start with anti-inflammatory Naprosyn (Naproxen) 500mg  twice daily (12 hrs apart, with food, breakfast and dinner) every day for next 1-2 weeks if helping, then can use only as needed 3. Start Baclofen (Lioresal) 10mg  tablets - cut in half for 5mg  at night for muscle relaxant - may make you sedated or sleepy (be careful driving or working on this) if tolerated you can take every 8 hours, half or whole tab 4. May use Tylenol Extra Str 500mg  tabs - may take 1-2 tablets every 6 hours as needed 5. Recommend to start using heating pad on your lower back 1-2x daily for few weeks  Also try a Wedge Seat Cushion to avoid nerve pinching when sitting prolonged period of time.  This pain may take weeks to months to fully resolve, but hopefully it will respond to the medicine initially. All back injuries (small or serious) are slow to heal since we use our back muscles every day. Be careful with turning, twisting, lifting, sitting / standing for prolonged periods, and avoid re-injury.  If your symptoms significantly worsen with more pain, or new symptoms with weakness in one or both legs, new or different shooting leg pains, numbness in legs or groin, loss of control or retention of urine or bowel movements, please call back for advice and you may need to go directly to the Emergency Department.  TDAP and Flu shot today  Follow-up 6 month for Annual Physical ([facility] orders prior)  DUE for FASTING BLOOD WORK (no food or drink after midnight before the lab appointment, only water or coffee without cream/sugar on the morning of)  Message me on mychart about 1-2 weeks prior to needing your bloodwork fasting before physical.    Please schedule a Follow-up Appointment to: Return in about 6 months (around 07/10/2020) for Follow-up 6 month for Annual Physical ([facility] orders prior).  If you  have any other questions or concerns, please feel free to call the office or send a message through [place]. You may also schedule an earlier appointment if necessary.  Additionally, you may be receiving a survey about your experience at our office within a few days to 1 week by e-mail or mail. We value your feedback.  [name], DO [place]

## 2020-01-10 NOTE — Progress Notes (Signed)
Subjective:    Patient ID: Lucas Lopez, male    DOB: July 20, 1960, 59 y.o.   MRN: [id number]  Lucas Lopez is a 59 y.o. male presenting on 01/10/2020 for Back Pain (onset month)   HPI   Subacute LEFT lower Chronic Back Pain Reports onset about 4 weeks, left low back pain, initiated after lifting heavy bee hive maybe a correlation but unsure if caused it directly - In past, he has been given Naproxen 400mg  approx for symptomatic treatment PRN, allowed him to move more naturally and it gradually healed. - He tried to self treat the Naproxen 1-2 times a day for 1 week with good results - It returned, then resolved  Today seems to be gradually improving. Describes pain as mild at rest and increasing severity more moderate with particular activities including sitting down motion. He has some mild symptoms affecting his Left leg chronically but no difference with this particular problem, occasional tingling, but not radicular sciatic pain.  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 - Denies any fevers/chills, numbness, tingling, weakness, loss of control bladder/bowel incontinence or retention, unintentional wt loss, night sweats    Health Maintenance:  Due for Flu Shot, will receive today   Due For TDap.   Depression screen [facility] 2/9 01/10/2020 11/05/2019 08/05/2019  Decreased Interest 0 0 0  Down, Depressed, Hopeless 0 0 0  PHQ - 2 Score 0 0 0    Social History   Tobacco Use  . Smoking status: Former Research scientist (life sciences)  . Smokeless tobacco: Former Network engineer Use Topics  . Alcohol use: Not Currently  . Drug use: Not Currently    Types: Marijuana     Review of Systems Per HPI unless specifically indicated above     Objective:    BP 122/66   Pulse 64   Temp 97.7 F (36.5 C) (Temporal)   Resp 16   Ht 6' (1.829 m)   Wt 174 lb (78.9 kg)   SpO2 98%   BMI 23.60 kg/m   Wt Readings from Last 3 Encounters:  01/10/20 174 lb (78.9 kg)  08/05/19 173 lb (78.5 kg)  07/02/19 174 lb 9.6 oz (79.2 kg)    Physical Exam Vitals and nursing note reviewed.  Constitutional:      General: He is not in acute distress.    Appearance: He is well-developed. He is not diaphoretic.     Comments: Well-appearing, comfortable, cooperative  HENT:     Head: Normocephalic and atraumatic.  Eyes:     General:        Right eye: No discharge.        Left eye: No discharge.     Conjunctiva/sclera: Conjunctivae normal.  Cardiovascular:     Rate and Rhythm: Normal rate.  Pulmonary:     Effort: Pulmonary effort is normal.  Musculoskeletal:     Comments: Low Back Inspection: Normal appearance, no spinal deformity, symmetrical. Palpation: No tenderness over spinous processes. Left lower paraspinal area lumbar with some hypertonicity and spasm. ROM: Full active ROM forward flex / back extension, rotation L/R without discomfort Special Testing: Seated SLR negative for radicular pain  bilaterally  Strength: Bilateral hip flex/ext 5/5, knee flex/ext 5/5, ankle dorsiflex/plantarflex 5/5 Neurovascular: intact distal sensation to light touch   Skin:    General: Skin is warm and dry.     Findings: No erythema or rash.  Neurological:     Mental Status: He is alert and oriented to person, place, and time.  Psychiatric:        Behavior: Behavior normal.     Comments: Well groomed, good eye contact, normal speech and thoughts       Results for orders placed or performed in visit on 10/25/19  Lipid panel  Result Value Ref Range   Cholesterol 177 <200 mg/dL   HDL 72 > OR = 40 mg/dL   Triglycerides 84 <150 mg/dL   LDL Cholesterol (Calc) 88 mg/dL (calc)    Total CHOL/HDL Ratio 2.5 <5.0 (calc)   Non-HDL Cholesterol (Calc) 105 <130 mg/dL (calc)  COMPLETE METABOLIC PANEL WITH GFR  Result Value Ref Range   Glucose, Bld 99 65 - 99 mg/dL   BUN 14 7 - 25 mg/dL   Creat 1.00 0.70 - 1.33 mg/dL   GFR, Est Non African American 83 > OR = 60 mL/min/1.12m2   GFR, Est African American 96 > OR = 60 mL/min/1.83m2   BUN/Creatinine Ratio NOT APPLICABLE 6 - 22 (calc)   Sodium 136 135 - 146 mmol/L   Potassium 4.8 3.5 - 5.3 mmol/L   Chloride 103 98 - 110 mmol/L   CO2 26 20 - 32 mmol/L   Calcium 9.8 8.6 - 10.3 mg/dL   Total Protein 6.9 6.1 - 8.1 g/dL   Albumin 5.0 3.6 - 5.1 g/dL   Globulin 1.9 1.9 - 3.7 g/dL (calc)   AG Ratio 2.6 (H) 1.0 - 2.5 (calc)   Total Bilirubin 0.8 0.2 - 1.2 mg/dL   Alkaline phosphatase (APISO) 49 35 - 144 U/L   AST 23 10 - 35 U/L   ALT 39 9 - 46 U/L      Assessment & Plan:   Problem List Items Addressed This Visit    DJD (degenerative joint disease) of cervical spine   Relevant Medications   baclofen (LIORESAL) 10 MG tablet   naproxen (NAPROSYN) 500 MG tablet    Other Visit Diagnoses    Acute left-sided low back pain without sciatica    -  Primary   Relevant Medications   baclofen (LIORESAL) 10 MG tablet   naproxen (NAPROSYN) 500 MG tablet   Needs flu shot       Relevant Orders   Flu Vaccine QUAD 36+ mos IM   Need for diphtheria-tetanus-pertussis (Tdap) vaccine       Relevant Orders   Tdap vaccine greater than or equal to 7yo IM   Muscle spasm of back       Relevant Medications   baclofen (LIORESAL) 10 MG tablet   naproxen (NAPROSYN) 500 MG tablet      Acute on Subacute L LBP without associated sciatica. Suspect likely due to muscle spasm/strain, with possible known lifting injury. No trauma. No significant known lower lumbar OA/DJD, has C-spine DJD No prior back surgery - No red flag symptoms. Negative SLR for radiculopathy - Inadequate conservative therapy   Plan: 1. ReStart anti-inflammatory trial with rx  rx Naproxen 500mg  BID 1-2 weeks then PRN, review risk benefit 2. Start muscle relaxant with Baclofen 10mg  tabs - take 5-10mg  up to TID PRN, titrate up as tolerated caution sedation 3. May use Tylenol PRN for breakthrough 4. Encouraged  use of heating pad 1-2x daily for now then PRN 5.  Follow-up 4-6 weeks if not improved for re-evaluation, consider X-ray imaging, trial of PT, and possibly referral to Orthopedic   Meds ordered this encounter  Medications  . baclofen (LIORESAL) 10 MG tablet    Sig: Take 0.5-1 tablets (5-10 mg total) by mouth 3 (three) times daily as needed for muscle spasms.    Dispense:  30 each    Refill:  0  . naproxen (NAPROSYN) 500 MG tablet    Sig: Take 1 tablet (500 mg total) by mouth 2 (two) times daily with a meal. For 2-4 weeks then as needed    Dispense:  60 tablet    Refill:  0      Follow up plan: Return in about 6 months (around 07/10/2020) for Follow-up 6 month for Annual Physical ([facility] orders prior).  Patient will notify me prior to his next physical when ready for [facility] orders  [name], [facility] 01/10/2020, 2:54 PM

## 2020-09-12 ENCOUNTER — Other Ambulatory Visit: Payer: Self-pay | Admitting: Family Medicine

## 2020-09-12 DIAGNOSIS — N529 Male erectile dysfunction, unspecified: Secondary | ICD-10-CM

## 2020-09-12 DIAGNOSIS — I10 Essential (primary) hypertension: Secondary | ICD-10-CM

## 2020-09-21 ENCOUNTER — Other Ambulatory Visit: Payer: Self-pay | Admitting: Family Medicine

## 2020-09-21 DIAGNOSIS — Z125 Encounter for screening for malignant neoplasm of prostate: Secondary | ICD-10-CM

## 2020-09-21 DIAGNOSIS — Z0000 Encounter for general adult medical examination without abnormal findings: Secondary | ICD-10-CM

## 2020-09-21 DIAGNOSIS — I10 Essential (primary) hypertension: Secondary | ICD-10-CM

## 2020-09-21 DIAGNOSIS — R7309 Other abnormal glucose: Secondary | ICD-10-CM

## 2020-09-21 DIAGNOSIS — E7800 Pure hypercholesterolemia, unspecified: Secondary | ICD-10-CM

## 2020-09-26 ENCOUNTER — Other Ambulatory Visit: Payer: Self-pay

## 2020-09-26 ENCOUNTER — Ambulatory Visit (INDEPENDENT_AMBULATORY_CARE_PROVIDER_SITE_OTHER): Payer: 59 | Admitting: Dermatology

## 2020-09-26 ENCOUNTER — Encounter: Payer: Self-pay | Admitting: Dermatology

## 2020-09-26 DIAGNOSIS — Z1283 Encounter for screening for malignant neoplasm of skin: Secondary | ICD-10-CM

## 2020-09-26 DIAGNOSIS — L814 Other melanin hyperpigmentation: Secondary | ICD-10-CM

## 2020-09-26 DIAGNOSIS — D1800 Hemangioma unspecified site: Secondary | ICD-10-CM

## 2020-09-26 DIAGNOSIS — L720 Epidermal cyst: Secondary | ICD-10-CM | POA: Diagnosis not present

## 2020-09-26 DIAGNOSIS — L80 Vitiligo: Secondary | ICD-10-CM

## 2020-09-26 DIAGNOSIS — L578 Other skin changes due to chronic exposure to nonionizing radiation: Secondary | ICD-10-CM

## 2020-09-26 DIAGNOSIS — D229 Melanocytic nevi, unspecified: Secondary | ICD-10-CM

## 2020-09-26 DIAGNOSIS — L821 Other seborrheic keratosis: Secondary | ICD-10-CM

## 2020-09-26 NOTE — Patient Instructions (Addendum)
Melanoma ABCDEs  Melanoma is the most dangerous type of skin cancer, and is the leading cause of death from skin disease.  You are more likely to develop melanoma if you: Have light-colored skin, light-colored eyes, or red or blond hair Spend a lot of time in the sun Tan regularly, either outdoors or in a tanning bed Have had blistering sunburns, especially during childhood Have a close family member who has had a melanoma Have atypical moles or large birthmarks  Early detection of melanoma is key since treatment is typically straightforward and cure rates are extremely high if we catch it early.   The first sign of melanoma is often a change in a mole or a new dark spot.  The ABCDE system is a way of remembering the signs of melanoma.  A for asymmetry:  The two halves do not match. B for border:  The edges of the growth are irregular. C for color:  A mixture of colors are present instead of an even brown color. D for diameter:  Melanomas are usually (but not always) greater than 81mm - the size of a pencil eraser. E for evolution:  The spot keeps changing in size, shape, and color.  Please check your skin once per month between visits. You can use a small mirror in front and a large mirror behind you to keep an eye on the back side or your body.   If you see any new or changing lesions before your next follow-up, please call to schedule a visit.  Please continue daily skin protection including broad spectrum sunscreen SPF 30+ to sun-exposed areas, reapplying every 2 hours as needed when you're outdoors.    Recommend taking Heliocare sun protection supplement daily in sunny weather for additional sun protection. For maximum protection on the sunniest days, you can take up to 2 capsules of regular Heliocare OR take 1 capsule of Heliocare Ultra. For prolonged exposure (such as a full day in the sun), you can repeat your dose of the supplement 4 hours after your first dose. Heliocare can be  purchased at [facility] or at [email or web address].    If you have any questions or concerns for your doctor, please call our main line at [phone] and press option 4 to reach your doctor's medical assistant. If no one answers, please leave a voicemail as directed and we will return your call as soon as possible. Messages left after 4 pm will be answered the following business day.   You may also send [place] a message via [place]. We typically respond to MyChart messages within 1-2 business days.  For prescription refills, please ask your pharmacy to contact our office. Our fax number is [phone].  If you have an urgent issue when the clinic is closed that cannot wait until the next business day, you can page your doctor at the number below.    Please note that while we do our best to be available for urgent issues outside of office hours, we are not available 24/7.   If you have an urgent issue and are unable to reach [place], you may choose to seek medical care at your doctor's office, retail clinic, urgent care center, or emergency room.  If you have a medical emergency, please immediately call 911 or go to the emergency department.  Pager Numbers  - Dr. Nehemiah Massed: [phone]  - Dr. Laurence Ferrari: [phone]  - Dr. Nicole Kindred: [phone]  In the event of inclement weather, please call our main line  at [phone] for an update on the status of any delays or closures.  Dermatology Medication Tips: Please keep the boxes that topical medications come in in order to help keep track of the instructions about where and how to use these. Pharmacies typically print the medication instructions only on the boxes and not directly on the medication tubes.   If your medication is too expensive, please contact our office at [phone] option 4 or send [place] a message through [place].   We are unable to tell what your co-pay for medications will be in advance as this is different depending on your  insurance coverage. However, we may be able to find a substitute medication at lower cost or fill out paperwork to get insurance to cover a needed medication.   If a prior authorization is required to get your medication covered by your insurance company, please allow [place] 1-2 business days to complete this process.  Drug prices often vary depending on where the prescription is filled and some pharmacies may offer cheaper prices.  The website www.goodrx.com contains coupons for medications through different pharmacies. The prices here do not account for what the cost may be with help from insurance (it may be cheaper with your insurance), but the website can give you the price if you did not use any insurance.  - You can print the associated coupon and take it with your prescription to the pharmacy.  - You may also stop by our office during regular business hours and pick up a GoodRx coupon card.  - If you need your prescription sent electronically to a different pharmacy, notify our office through [facility] or by phone at [phone] option 4.

## 2020-09-26 NOTE — Progress Notes (Signed)
   New Patient Visit  Subjective  Lucas Lopez is a 60 y.o. male who presents for the following: FBSE (Patient here for full body skin exam and skin cancer screening. Patient with no hx of skin cancer. Nothing new or changing that patient is aware of.).  Patient with hx of vitiligo and fhx psoriasis.   The following portions of the chart were reviewed this encounter and updated as appropriate:   Tobacco  Allergies  Meds  Problems  Med Hx  Surg Hx  Fam Hx       Review of Systems:  No other skin or systemic complaints except as noted in HPI or Assessment and Plan.  Objective  Well appearing patient in no apparent distress; mood and affect are within normal limits.  A full examination was performed including scalp, head, eyes, ears, nose, lips, neck, chest, axillae, abdomen, back, buttocks, bilateral upper extremities, bilateral lower extremities, hands, feet, fingers, toes, fingernails, and toenails. All findings within normal limits unless otherwise noted below.  Left Lower Back, left axilla Subcutaneous nodule.   Right Hand - Posterior Depigmented patches at hands   Assessment & Plan  Epidermal inclusion cyst Left Lower Back, left axilla  Benign-appearing.  Observation.  Call clinic for new or changing lesions.  Recommend daily use of broad spectrum spf 30+ sunscreen to sun-exposed areas.    Vitiligo Right Hand - Posterior  Reviewed chronic nature, no cure and can be difficult to treat.  Vitiligo is an autoimmune condition. Not bothersome so treatment deferred.   Reviewed association with thyroid disease. Reviewed TSH, wnl   Lentigines - Scattered tan macules - Due to sun exposure - Benign-appering, observe - Recommend daily broad spectrum sunscreen SPF 30+ to sun-exposed areas, reapply every 2 hours as needed. - Call for any changes  Seborrheic Keratoses - Stuck-on, waxy, tan-brown papules and/or plaques  - Benign-appearing - Discussed benign etiology  and prognosis. - Observe - Call for any changes  Melanocytic Nevi - Tan-brown and/or pink-flesh-colored symmetric macules and papules - Benign appearing on exam today - Observation - Call clinic for new or changing moles - Recommend daily use of broad spectrum spf 30+ sunscreen to sun-exposed areas.   Hemangiomas - Red papules - Discussed benign nature - Observe - Call for any changes  Actinic Damage - Chronic condition, secondary to cumulative UV/sun exposure - diffuse scaly erythematous macules with underlying dyspigmentation - Recommend daily broad spectrum sunscreen SPF 30+ to sun-exposed areas, reapply every 2 hours as needed.  - Staying in the shade or wearing long sleeves, sun glasses (UVA+UVB protection) and wide brim hats (4-inch brim around the entire circumference of the hat) are also recommended for sun protection.  - Call for new or changing lesions.  Skin cancer screening performed today.  Return in about 1 year (around 09/26/2021) for TBSE.  Graciella Belton, RMA, am acting as scribe for Forest Gleason, MD .  Documentation: I have reviewed the above documentation for accuracy and completeness, and I agree with the above.  Forest Gleason, MD

## 2020-09-29 ENCOUNTER — Telehealth: Payer: Self-pay | Admitting: Family Medicine

## 2020-09-29 NOTE — Telephone Encounter (Signed)
Pt is calling to see if orders can be send for lab work to [facility] in [place]. Please advise Cb- [phone]

## 2020-09-29 NOTE — Telephone Encounter (Signed)
Switched orders from [facility] to [facility] in chart.  Typically we have them in chart and they should be released prior to lab draw. Either by [facility] or we can do it if patient calls [place] day before or day of.  Or one option since he has a specific location in mind, can print them and fax to that [facility] draw site. I am not sure how early in advance you can do that.  [name], [facility] 09/29/2020, 5:13 PM

## 2020-09-29 NOTE — Addendum Note (Signed)
Addended by: Olin Hauser on: 09/29/2020 05:12 PM   Modules accepted: Orders

## 2020-10-10 ENCOUNTER — Other Ambulatory Visit: Payer: Self-pay

## 2020-10-10 ENCOUNTER — Other Ambulatory Visit: Payer: 59

## 2020-10-10 DIAGNOSIS — Z125 Encounter for screening for malignant neoplasm of prostate: Secondary | ICD-10-CM

## 2020-10-10 DIAGNOSIS — I10 Essential (primary) hypertension: Secondary | ICD-10-CM

## 2020-10-10 DIAGNOSIS — R7309 Other abnormal glucose: Secondary | ICD-10-CM

## 2020-10-10 DIAGNOSIS — Z0000 Encounter for general adult medical examination without abnormal findings: Secondary | ICD-10-CM

## 2020-10-10 DIAGNOSIS — E7800 Pure hypercholesterolemia, unspecified: Secondary | ICD-10-CM

## 2020-10-11 LAB — COMPREHENSIVE METABOLIC PANEL
AG Ratio: 2.3 (calc) (ref 1.0–2.5)
ALT: 26 U/L (ref 9–46)
AST: 19 U/L (ref 10–35)
Albumin: 4.6 g/dL (ref 3.6–5.1)
Alkaline phosphatase (APISO): 34 U/L — ABNORMAL LOW (ref 35–144)
BUN: 12 mg/dL (ref 7–25)
CO2: 27 mmol/L (ref 20–32)
Calcium: 9.6 mg/dL (ref 8.6–10.3)
Chloride: 102 mmol/L (ref 98–110)
Creat: 0.9 mg/dL (ref 0.70–1.33)
Globulin: 2 g/dL (calc) (ref 1.9–3.7)
Glucose, Bld: 102 mg/dL — ABNORMAL HIGH (ref 65–99)
Potassium: 4.7 mmol/L (ref 3.5–5.3)
Sodium: 137 mmol/L (ref 135–146)
Total Bilirubin: 0.7 mg/dL (ref 0.2–1.2)
Total Protein: 6.6 g/dL (ref 6.1–8.1)

## 2020-10-11 LAB — CBC WITH DIFFERENTIAL/PLATELET
Absolute Monocytes: 414 cells/uL (ref 200–950)
Basophils Absolute: 32 cells/uL (ref 0–200)
Basophils Relative: 0.7 %
Eosinophils Absolute: 50 cells/uL (ref 15–500)
Eosinophils Relative: 1.1 %
HCT: 45.9 % (ref 38.5–50.0)
Hemoglobin: 14.6 g/dL (ref 13.2–17.1)
Lymphs Abs: 1683 cells/uL (ref 850–3900)
MCH: 31.6 pg (ref 27.0–33.0)
MCHC: 31.8 g/dL — ABNORMAL LOW (ref 32.0–36.0)
MCV: 99.4 fL (ref 80.0–100.0)
MPV: 11 fL (ref 7.5–12.5)
Monocytes Relative: 9.2 %
Neutro Abs: 2322 cells/uL (ref 1500–7800)
Neutrophils Relative %: 51.6 %
Platelets: 145 10*3/uL (ref 140–400)
RBC: 4.62 10*6/uL (ref 4.20–5.80)
RDW: 13.1 % (ref 11.0–15.0)
Total Lymphocyte: 37.4 %
WBC: 4.5 10*3/uL (ref 3.8–10.8)

## 2020-10-11 LAB — LIPID PANEL
Cholesterol: 194 mg/dL (ref <200)
HDL: 75 mg/dL (ref >=40)
LDL Cholesterol (Calc): 97 mg/dL (calc)
Non-HDL Cholesterol (Calc): 119 mg/dL (calc) (ref <130)
Total CHOL/HDL Ratio: 2.6 (calc) (ref <5.0)
Triglycerides: 123 mg/dL (ref <150)

## 2020-10-11 LAB — TSH: TSH: 1.41 mIU/L (ref 0.40–4.50)

## 2020-10-11 LAB — HEMOGLOBIN A1C
Hgb A1c MFr Bld: 5.3 % of total Hgb (ref <5.7)
Mean Plasma Glucose: 105 mg/dL
eAG (mmol/L): 5.8 mmol/L

## 2020-10-11 LAB — PSA: PSA: 0.87 ng/mL (ref <=4.00)

## 2020-10-12 ENCOUNTER — Other Ambulatory Visit: Payer: 59

## 2020-10-17 ENCOUNTER — Other Ambulatory Visit: Payer: Self-pay

## 2020-10-17 ENCOUNTER — Ambulatory Visit (INDEPENDENT_AMBULATORY_CARE_PROVIDER_SITE_OTHER): Payer: 59 | Admitting: Family Medicine

## 2020-10-17 ENCOUNTER — Encounter: Payer: Self-pay | Admitting: Family Medicine

## 2020-10-17 VITALS — BP 124/66 | HR 75 | Ht 72.0 in | Wt 182.6 lb

## 2020-10-17 DIAGNOSIS — N529 Male erectile dysfunction, unspecified: Secondary | ICD-10-CM

## 2020-10-17 DIAGNOSIS — I10 Essential (primary) hypertension: Secondary | ICD-10-CM

## 2020-10-17 DIAGNOSIS — R809 Proteinuria, unspecified: Secondary | ICD-10-CM

## 2020-10-17 DIAGNOSIS — E7800 Pure hypercholesterolemia, unspecified: Secondary | ICD-10-CM | POA: Diagnosis not present

## 2020-10-17 DIAGNOSIS — Z23 Encounter for immunization: Secondary | ICD-10-CM

## 2020-10-17 DIAGNOSIS — Z0000 Encounter for general adult medical examination without abnormal findings: Secondary | ICD-10-CM

## 2020-10-17 LAB — POCT UA - MICROALBUMIN: Microalbumin Ur, POC: NEGATIVE mg/L

## 2020-10-17 MED ORDER — BYSTOLIC 5 MG PO TABS: 15.0000 mg | ORAL_TABLET | Freq: Every day | ORAL | 3 refills | Status: DC

## 2020-10-17 MED ORDER — ROSUVASTATIN CALCIUM 5 MG PO TABS
5.0000 mg | ORAL_TABLET | Freq: Every day | ORAL | 3 refills | Status: DC
Start: 2020-10-17 — End: 2020-10-17

## 2020-10-17 MED ORDER — ROSUVASTATIN CALCIUM 5 MG PO TABS: 5.0000 mg | ORAL_TABLET | Freq: Every day | ORAL | 3 refills | Status: DC

## 2020-10-17 MED ORDER — TADALAFIL 5 MG PO TABS: 5.0000 mg | ORAL_TABLET | Freq: Every day | ORAL | 3 refills | Status: DC

## 2020-10-17 NOTE — Patient Instructions (Addendum)
Thank you for coming to the office today.  May be tension frontal headache from physical strain of riding Consider rebound headache from Tylenol - Try to break up the pattern a bit, may try Ibuprofen or Aleve instead some days, can trial an Excedrin also if need - Caution with caffeine - could cause caffeine rebound headache as well.  Please schedule and return for a NURSE ONLY VISIT for VACCINE - Approximately around 2-6 months - Need dose #2 Shingrix vaccine for shingles  If neck symptoms do not resolve within 4-6 week total, let me know we can order an Ultrasound.  DUE for FASTING BLOOD WORK (no food or drink after midnight before the lab appointment, only water or coffee without cream/sugar on the morning of)  SCHEDULE "Lab Only" visit in the morning at the clinic for lab draw in 1 YEAR  - Make sure Lab Only appointment is at about 1 week before your next appointment, so that results will be available  For Lab Results, once available within 2-3 days of blood draw, you can can log in to MyChart online to view your results and a brief explanation. Also, we can discuss results at next follow-up visit.   Please schedule a Follow-up Appointment to: Return in about 1 year (around 10/17/2021) for 1 year fasting lab only then 1 week later Annual Physical.  If you have any other questions or concerns, please feel free to call the office or send a message through [place]. You may also schedule an earlier appointment if necessary.  Additionally, you may be receiving a survey about your experience at our office within a few days to 1 week by e-mail or mail. We value your feedback.  [name], DO [place]

## 2020-10-17 NOTE — Progress Notes (Signed)
Subjective:    Patient ID: Lucas Lopez, male    DOB: 12-08-1960, 60 y.o.   MRN: [id number]  Lucas Lopez is a 60 y.o. male presenting on 10/17/2020 for Annual Exam  HPI  Here Annual Physical and Lab Review.   Lifestyle / Hyperlipidemia / BMI >24  Reviewed BP readings avg 110-120s, high 130. HR 40-60  Riding Bike more regularly, up to 26 miles per day, up to 5 days a week - He feels like he hits a "glucose barrier" or running out of energy, so taking in extra sugar at times to help fuel. - Fasting glucose 102, but A1c was normal range 5.3 now  Overal he is doing well, balanced diet and regular exercise Maternal history T2DM  Cholesterol controlled on low dose Rosuvastatin 5mg  for cardiovascular risk reduction Known fam history of Hyperlipidemia, heart disease, mother massive MI age 59   Hypertension No new problem. BP controlled Taking Bystolic 15mg  daily 5mg  x 3  Dermatology Dr Laurence Ferrari On Heliocare topical  Additional concern:  Describes a sneeze episode recently, caused swelling and tender and some symptoms pain in jaw bilateral Reassurance normal Thyroid TSH He asks about swollen glandular area left lower neck  Frontal headaches, mild, usually after activity, improved w/ Tylenol - Often he takes Tylenol before riding bike for joint symptoms   Chronic trace proteinuria Today U/A trace protein, long term history of trace protein in urine. Never had further testing.   Health Maintenance:   Prostate CA Screening: Prior PSA / DRE reported normal. Last PSA 0.87 (10/2020) negative. Currently asymptomatic. No known family history of prostate CA.    Colon CA Screening: Last Colonoscopy 07/07/19 (done by [facility]), results with 1 pre-cancerous polyp benign, and internal hemorrhoid, diverticula, repeat in 5 years recommended scanned to chart. No known family history of colon CA. Next due 5 years, or 2026  Falls per year 6, occasional stumble and fall while in woods,  only with tripping, not having actual regular unprompted falls.   Depression screen [facility] 2/9 10/17/2020 01/10/2020 11/05/2019  Decreased Interest 0 0 0  Down, Depressed, Hopeless 0 0 0  PHQ - 2 Score 0 0 0  Altered sleeping 0 - -  Tired, decreased energy 1 - -  Change in appetite 2 - -  Feeling bad or failure about yourself  0 - -  Trouble concentrating 0 - -  Moving slowly or fidgety/restless 0 - -  Suicidal thoughts 0 - -  PHQ-9 Score 3 - -  Difficult doing work/chores Not difficult at all - -   GAD 7 : Generalized Anxiety Score 10/17/2020  Nervous, Anxious, on Edge 0  Control/stop worrying 0  Worry too much - different things 0  Trouble relaxing 0  Restless 0  Easily annoyed or irritable 0  Afraid - awful might happen 0  Total GAD 7 Score 0  Anxiety Difficulty Not difficult at all      Past Medical History:  Diagnosis Date   Diverticulitis    Eczema    Gout    In the left most 3 toes of left foot   Vitiligo    History reviewed. No pertinent surgical history. Social History   Socioeconomic History   Marital status: Married    Spouse name: Burhan Barham   Number of children: Not on file   Years of education: Not on file   Highest education level: Not on file  Occupational History   Occupation: Scientist, forensic  Comment: (retiring in Summer 2021)  Tobacco Use   Smoking status: Former    Pack years: 0.00   Smokeless tobacco: Former  Substance and Sexual Activity   Alcohol use: Not Currently   Drug use: Not Currently    Types: Marijuana   Sexual activity: Not on file  Other Topics Concern   Not on file  Social History Narrative   Not on file   Social Determinants of Health   Financial Resource Strain: Not on file  Food Insecurity: Not on file  Transportation Needs: Not on file  Physical Activity: Not on file  Stress: Not on file  Social Connections: Not on file  Intimate Partner Violence: Not on file   Family History  Problem Relation Age of  Onset   Hypertension Mother    Heart attack Mother 61   Fibroids Mother    Pneumonia Father 62   Hyperlipidemia Father    Hypertension Father    Osteoarthritis Father    Psoriasis Sister    Skin cancer Sister    Current Outpatient Medications on File Prior to Visit  Medication Sig   baclofen (LIORESAL) 10 MG tablet Take 0.5-1 tablets (5-10 mg total) by mouth 3 (three) times daily as needed for muscle spasms.   clotrimazole-betamethasone (LOTRISONE) cream Apply 1 application topically 2 (two) times daily.   cromolyn (NASALCROM) 5.2 MG/ACT nasal spray Place 1 spray into both nostrils daily as needed.   magnesium oxide (MAG-OX) 400 MG tablet Take 400 mg by mouth daily.   naproxen (NAPROSYN) 500 MG tablet Take 1 tablet (500 mg total) by mouth 2 (two) times daily with a meal. For 2-4 weeks then as needed   omeprazole (PRILOSEC) 20 MG capsule Take 1 capsule (20 mg total) by mouth daily before breakfast.   Polypodium Leucotomos (HELIOCARE PO) Take 60 mg by mouth.   ascorbic acid (VITAMIN C) 500 MG tablet Take 500 mg by mouth daily. (Patient not taking: Reported on 10/17/2020)   Cholecalciferol (VITAMIN D) 50 MCG (2000 UT) CAPS Take 2,000 Units by mouth daily. (Patient not taking: Reported on 10/17/2020)   No current facility-administered medications on file prior to visit.    Review of Systems  Constitutional:  Negative for activity change, appetite change, chills, diaphoresis, fatigue and fever.  HENT:  Negative for congestion and hearing loss.   Eyes:  Negative for visual disturbance.  Respiratory:  Negative for cough, chest tightness, shortness of breath and wheezing.   Cardiovascular:  Negative for chest pain, palpitations and leg swelling.  Gastrointestinal:  Negative for abdominal pain, constipation, diarrhea, nausea and vomiting.  Genitourinary:  Negative for dysuria, frequency and hematuria.  Musculoskeletal:  Negative for arthralgias and neck pain.  Skin:  Negative for rash.   Neurological:  Negative for dizziness, weakness, light-headedness, numbness and headaches.  Hematological:  Negative for adenopathy.  Psychiatric/Behavioral:  Negative for behavioral problems, dysphoric mood and sleep disturbance.   Per HPI unless specifically indicated above     Objective:    BP 124/66 (BP Location: Left Arm, Patient Position: Sitting, Cuff Size: Normal)   Pulse 75   Ht 6' (1.829 m)   Wt 182 lb 9.6 oz (82.8 kg)   SpO2 98%   BMI 24.77 kg/m   Wt Readings from Last 3 Encounters:  10/17/20 182 lb 9.6 oz (82.8 kg)  01/10/20 174 lb (78.9 kg)  08/05/19 173 lb (78.5 kg)    Physical Exam Vitals and nursing note reviewed.  Constitutional:  General: He is not in acute distress.    Appearance: He is well-developed. He is not diaphoretic.     Comments: Well-appearing, comfortable, cooperative  HENT:     Head: Normocephalic and atraumatic.  Eyes:     General:        Right eye: No discharge.        Left eye: No discharge.     Conjunctiva/sclera: Conjunctivae normal.     Pupils: Pupils are equal, round, and reactive to light.  Neck:     Thyroid: No thyromegaly.     Vascular: No carotid bruit.     Comments: Mild reproduced localized L sided lower lateral tenderness, no lymphadenopathy Cardiovascular:     Rate and Rhythm: Normal rate and regular rhythm.     Pulses: Normal pulses.     Heart sounds: Normal heart sounds. No murmur heard. Pulmonary:     Effort: Pulmonary effort is normal. No respiratory distress.     Breath sounds: Normal breath sounds. No wheezing or rales.  Abdominal:     General: Bowel sounds are normal. There is no distension.     Palpations: Abdomen is soft. There is no mass.     Tenderness: There is no abdominal tenderness.  Musculoskeletal:        General: No tenderness. Normal range of motion.     Cervical back: Normal range of motion and neck supple.     Right lower leg: No edema.     Left lower leg: No edema.     Comments: Upper /  Lower Extremities: - Normal muscle tone, strength bilateral upper extremities 5/5, lower extremities 5/5  Lymphadenopathy:     Cervical: No cervical adenopathy.  Skin:    General: Skin is warm and dry.     Findings: No erythema or rash.  Neurological:     Mental Status: He is alert and oriented to person, place, and time.     Comments: Distal sensation intact to light touch all extremities  Psychiatric:        Mood and Affect: Mood normal.        Behavior: Behavior normal.        Thought Content: Thought content normal.     Comments: Well groomed, good eye contact, normal speech and thoughts      Results for orders placed or performed in visit on 10/10/20  TSH  Result Value Ref Range   TSH 1.41 0.40 - 4.50 mIU/L  Comprehensive Metabolic Panel (CMET)  Result Value Ref Range   Glucose, Bld 102 (H) 65 - 99 mg/dL   BUN 12 7 - 25 mg/dL   Creat 0.90 0.70 - 1.33 mg/dL   BUN/Creatinine Ratio NOT APPLICABLE 6 - 22 (calc)   Sodium 137 135 - 146 mmol/L   Potassium 4.7 3.5 - 5.3 mmol/L   Chloride 102 98 - 110 mmol/L   CO2 27 20 - 32 mmol/L   Calcium 9.6 8.6 - 10.3 mg/dL   Total Protein 6.6 6.1 - 8.1 g/dL   Albumin 4.6 3.6 - 5.1 g/dL   Globulin 2.0 1.9 - 3.7 g/dL (calc)   AG Ratio 2.3 1.0 - 2.5 (calc)   Total Bilirubin 0.7 0.2 - 1.2 mg/dL   Alkaline phosphatase (APISO) 34 (L) 35 - 144 U/L   AST 19 10 - 35 U/L   ALT 26 9 - 46 U/L  PSA  Result Value Ref Range   PSA 0.87 < OR = 4.00 ng/mL  HgB A1c  Result  Value Ref Range   Hgb A1c MFr Bld 5.3 <5.7 % of total Hgb   Mean Plasma Glucose 105 mg/dL   eAG (mmol/L) 5.8 mmol/L  Lipid panel  Result Value Ref Range   Cholesterol 194 <200 mg/dL   HDL 75 > OR = 40 mg/dL   Triglycerides 123 <150 mg/dL   LDL Cholesterol (Calc) 97 mg/dL (calc)   Total CHOL/HDL Ratio 2.6 <5.0 (calc)   Non-HDL Cholesterol (Calc) 119 <130 mg/dL (calc)  CBC with Differential  Result Value Ref Range   WBC 4.5 3.8 - 10.8 Thousand/uL   RBC 4.62 4.20 - 5.80  Million/uL   Hemoglobin 14.6 13.2 - 17.1 g/dL   HCT 45.9 38.5 - 50.0 %   MCV 99.4 80.0 - 100.0 fL   MCH 31.6 27.0 - 33.0 pg   MCHC 31.8 (L) 32.0 - 36.0 g/dL   RDW 13.1 11.0 - 15.0 %   Platelets 145 140 - 400 Thousand/uL   MPV 11.0 7.5 - 12.5 fL   Neutro Abs 2,322 1,500 - 7,800 cells/uL   Lymphs Abs 1,683 850 - 3,900 cells/uL   Absolute Monocytes 414 200 - 950 cells/uL   Eosinophils Absolute 50 15 - 500 cells/uL   Basophils Absolute 32 0 - 200 cells/uL   Neutrophils Relative % 51.6 %   Total Lymphocyte 37.4 %   Monocytes Relative 9.2 %   Eosinophils Relative 1.1 %   Basophils Relative 0.7 %      Assessment & Plan:   Problem List Items Addressed This Visit     Pure hypercholesterolemia   Relevant Medications   rosuvastatin (CRESTOR) 5 MG tablet   BYSTOLIC 5 MG tablet   tadalafil (CIALIS) 5 MG tablet   Essential hypertension   Relevant Medications   rosuvastatin (CRESTOR) 5 MG tablet   BYSTOLIC 5 MG tablet   tadalafil (CIALIS) 5 MG tablet   Erectile dysfunction   Relevant Medications   tadalafil (CIALIS) 5 MG tablet   Other Visit Diagnoses     Annual physical exam    -  Primary   Need for shingles vaccine       Relevant Orders   Varicella-zoster vaccine IM   Proteinuria, unspecified type       Relevant Orders   POCT UA - Microalbumin       Updated Health Maintenance information  Shingrix #1 vaccine today, repeat dose 2-6 months nurse visit for vaccine.  PSA negative  Next Colonoscopy 2026  Reviewed recent lab results with patient Encouraged improvement to lifestyle with diet and exercise Goal of weight loss  Lipids - Controlled on Rosuvastatin 5mg  low dose, reduce ASCVD risk  A1c improved to 5.3, controlled no sign of advance to PreDM. Had mild elevated glucose 102 but likely with dietary changes.  Hx proteinuria, test today on Microalbumin test was negative. No sign of significant proteinuria.  HTN Refill Bystolic  ED Refill Tadalafil,  goodrx  Unable to send rx to [facility] via E-script - repeat attempts made finally it appears rx was submitted to pharmacy and it was received 530pm.  Also taking Lactaid pill helps manage his bowels / stools   Meds ordered this encounter  Medications   DISCONTD: rosuvastatin (CRESTOR) 5 MG tablet    Sig: Take 1 tablet (5 mg total) by mouth at bedtime.    Dispense:  90 tablet    Refill:  3   DISCONTD: BYSTOLIC 5 MG tablet    Sig: Take 3 tablets (15 mg total) by  mouth daily.    Dispense:  270 tablet    Refill:  3   DISCONTD: BYSTOLIC 5 MG tablet    Sig: Take 3 tablets (15 mg total) by mouth daily.    Dispense:  270 tablet    Refill:  3   DISCONTD: rosuvastatin (CRESTOR) 5 MG tablet    Sig: Take 1 tablet (5 mg total) by mouth at bedtime.    Dispense:  90 tablet    Refill:  3   rosuvastatin (CRESTOR) 5 MG tablet    Sig: Take 1 tablet (5 mg total) by mouth at bedtime.    Dispense:  90 tablet    Refill:  3   BYSTOLIC 5 MG tablet    Sig: Take 3 tablets (15 mg total) by mouth daily.    Dispense:  270 tablet    Refill:  3   tadalafil (CIALIS) 5 MG tablet    Sig: Take 1 tablet (5 mg total) by mouth daily.    Dispense:  90 tablet    Refill:  3    Follow up plan: Return in about 1 year (around 10/17/2021) for 1 year fasting lab only then 1 week later Annual Physical.  Future 1 year   [name], [facility] 10/17/2020, 1:35 PM

## 2020-11-12 ENCOUNTER — Encounter: Payer: Self-pay | Admitting: Family Medicine

## 2020-11-12 DIAGNOSIS — R590 Localized enlarged lymph nodes: Secondary | ICD-10-CM

## 2020-11-12 DIAGNOSIS — M542 Cervicalgia: Secondary | ICD-10-CM

## 2020-11-14 NOTE — Addendum Note (Signed)
Addended by: Olin Hauser on: 11/14/2020 01:16 PM   Modules accepted: Orders

## 2020-11-21 ENCOUNTER — Ambulatory Visit
Admission: RE | Admit: 2020-11-21 | Discharge: 2020-11-21 | Disposition: A | Discharge status: Home / Self Care | Payer: 59 | Source: Ambulatory Visit | Attending: Family Medicine | Admitting: Family Medicine

## 2020-11-21 ENCOUNTER — Other Ambulatory Visit: Payer: Self-pay

## 2020-11-21 DIAGNOSIS — M542 Cervicalgia: Secondary | ICD-10-CM | POA: Diagnosis present

## 2020-11-21 DIAGNOSIS — R590 Localized enlarged lymph nodes: Secondary | ICD-10-CM | POA: Diagnosis present

## 2020-11-23 ENCOUNTER — Other Ambulatory Visit: Payer: Self-pay

## 2020-11-23 DIAGNOSIS — N529 Male erectile dysfunction, unspecified: Secondary | ICD-10-CM

## 2020-11-23 MED ORDER — TADALAFIL 5 MG PO TABS: 5.0000 mg | ORAL_TABLET | Freq: Every day | ORAL | 3 refills | Status: DC

## 2020-12-04 ENCOUNTER — Encounter: Payer: Self-pay | Admitting: Family Medicine

## 2020-12-07 ENCOUNTER — Encounter: Payer: Self-pay | Admitting: Family Medicine

## 2020-12-07 DIAGNOSIS — R590 Localized enlarged lymph nodes: Secondary | ICD-10-CM

## 2020-12-07 DIAGNOSIS — M542 Cervicalgia: Secondary | ICD-10-CM

## 2020-12-08 NOTE — Addendum Note (Signed)
Addended by: Olin Hauser on: 12/08/2020 12:46 PM   Modules accepted: Orders

## 2021-01-18 ENCOUNTER — Other Ambulatory Visit: Payer: Self-pay

## 2021-01-18 ENCOUNTER — Ambulatory Visit (INDEPENDENT_AMBULATORY_CARE_PROVIDER_SITE_OTHER): Payer: 59

## 2021-01-18 VITALS — Wt 182.0 lb

## 2021-01-18 DIAGNOSIS — Z23 Encounter for immunization: Secondary | ICD-10-CM | POA: Diagnosis not present

## 2021-08-03 ENCOUNTER — Other Ambulatory Visit: Payer: Self-pay | Admitting: Family Medicine

## 2021-08-03 DIAGNOSIS — I10 Essential (primary) hypertension: Secondary | ICD-10-CM

## 2021-08-03 MED ORDER — BISOPROLOL FUMARATE 5 MG PO TABS: 5.0000 mg | ORAL_TABLET | Freq: Every day | ORAL | 0 refills | Status: DC

## 2021-08-06 ENCOUNTER — Encounter: Payer: Self-pay | Admitting: Dermatology

## 2021-08-09 ENCOUNTER — Encounter: Payer: Self-pay | Admitting: Family Medicine

## 2021-08-09 DIAGNOSIS — I10 Essential (primary) hypertension: Secondary | ICD-10-CM

## 2021-08-09 MED ORDER — NEBIVOLOL HCL 5 MG PO TABS: 5.0000 mg | ORAL_TABLET | Freq: Three times a day (TID) | ORAL | 3 refills | Status: DC

## 2021-08-15 ENCOUNTER — Encounter: Payer: Self-pay | Admitting: Family Medicine

## 2021-08-15 ENCOUNTER — Ambulatory Visit (INDEPENDENT_AMBULATORY_CARE_PROVIDER_SITE_OTHER): Payer: 59 | Admitting: Family Medicine

## 2021-08-15 VITALS — BP 132/88 | HR 71 | Ht 72.0 in | Wt 194.6 lb

## 2021-08-15 DIAGNOSIS — L509 Urticaria, unspecified: Secondary | ICD-10-CM

## 2021-08-15 DIAGNOSIS — K2289 Other specified disease of esophagus: Secondary | ICD-10-CM

## 2021-08-15 DIAGNOSIS — M7702 Medial epicondylitis, left elbow: Secondary | ICD-10-CM

## 2021-08-15 DIAGNOSIS — K219 Gastro-esophageal reflux disease without esophagitis: Secondary | ICD-10-CM

## 2021-08-15 MED ORDER — OMEPRAZOLE 20 MG PO CPDR: 20.0000 mg | DELAYED_RELEASE_CAPSULE | Freq: Every day | ORAL | 1 refills | Status: AC | PRN

## 2021-08-15 NOTE — Progress Notes (Signed)
? ?Subjective:  ? ? Patient ID: Lucas Lopez, male    DOB: 22-Nov-1960, 61 y.o.   MRN: [id number] ? ?Lucas Lopez is a 61 y.o. male presenting on 08/15/2021 for Rash ? ? ?HPI ? ?Hives ?Admits early April 1 month ago, he developed scattered hives and a lower lip swelling reaction. Admits he did not treat anything until 07/23/21 he started Zyrtec. Later that week or weekend he developed larger hive swelling in foreskin of his penis. He said  ? ?Currently has multiple raised red urticarial hives rash left inner groin and upper  ? ?Started back on Dermatology treatment. ?Now he is outside more ? ?He stopped taking Rosuvastatin '5mg'$  for 2 days and he said his current hives remained, but seemed to be diminishing now. No new hives in morning. He then restarted statin and then developed hives the next morning. ? ?He admits cats in the house and they are hypoallergic not impacting him in this way, in the past other cats have caused respiratory traditional allergy ? ?Fam history Cardiac with mother MI at age 59 ? ? ? ?  08/15/2021  ? 10:42 AM 10/17/2020  ?  1:27 PM 01/10/2020  ?  2:47 PM  ?Depression screen PHQ 2/9  ?Decreased Interest 0 0 0  ?Down, Depressed, Hopeless 0 0 0  ?PHQ - 2 Score 0 0 0  ?Altered sleeping 1 0   ?Tired, decreased energy 1 1   ?Change in appetite 1 2   ?Feeling bad or failure about yourself  0 0   ?Trouble concentrating 0 0   ?Moving slowly or fidgety/restless 0 0   ?Suicidal thoughts 0 0   ?PHQ-9 Score 3 3   ?Difficult doing work/chores Not difficult at all Not difficult at all   ? ? ?Social History  ? ?Tobacco Use  ? Smoking status: Former  ? Smokeless tobacco: Former  ?Substance Use Topics  ? Alcohol use: Not Currently  ? Drug use: Not Currently  ?  Types: Marijuana  ? ? ?Review of Systems ?Per HPI unless specifically indicated above ? ?   ?Objective:  ?  ?BP 132/88   Pulse 71   Ht 6' (1.829 m)   Wt 194 lb 9.6 oz (88.3 kg)   SpO2 98%   BMI 26.39 kg/m?   ?Wt Readings from Last 3 Encounters:   ?08/15/21 194 lb 9.6 oz (88.3 kg)  ?01/18/21 182 lb (82.6 kg)  ?10/17/20 182 lb 9.6 oz (82.8 kg)  ?  ?Physical Exam ?Vitals and nursing note reviewed.  ?Constitutional:   ?   General: He is not in acute distress. ?   Appearance: Normal appearance. He is well-developed. He is not diaphoretic.  ?   Comments: Well-appearing, comfortable, cooperative  ?HENT:  ?   Head: Normocephalic and atraumatic.  ?Eyes:  ?   General:     ?   Right eye: No discharge.     ?   Left eye: No discharge.  ?   Conjunctiva/sclera: Conjunctivae normal.  ?Cardiovascular:  ?   Rate and Rhythm: Normal rate.  ?Pulmonary:  ?   Effort: Pulmonary effort is normal.  ?Skin: ?   General: Skin is warm and dry.  ?   Findings: Rash (urticarial rash left arm and left lower abdomen groin) present. No erythema.  ?Neurological:  ?   Mental Status: He is alert and oriented to person, place, and time.  ?Psychiatric:     ?   Mood and Affect: Mood normal.     ?  Behavior: Behavior normal.     ?   Thought Content: Thought content normal.  ?   Comments: Well groomed, good eye contact, normal speech and thoughts  ? ?Results for orders placed or performed in visit on 10/17/20  ?POCT UA - Microalbumin  ?Result Value Ref Range  ? Microalbumin Ur, POC Negative mg/L  ? Creatinine, POC    ? Albumin/Creatinine Ratio, Urine, POC    ? ?   ?Assessment & Plan:  ? ?Problem List Items Addressed This Visit   ? ? Mucosal abnormality of esophagus  ? Relevant Medications  ? omeprazole (PRILOSEC) 20 MG capsule  ? Gastroesophageal reflux disease  ? Relevant Medications  ? omeprazole (PRILOSEC) 20 MG capsule  ? ?Other Visit Diagnoses   ? ? Medial epicondylitis of left elbow    -  Primary  ? ?  ?  ?Urticarial Rash ?Allergic reaction ?Uncertain exact etiology ?Discussed that it sounds most likely Rosvusastatin causing the hives rash, despite being on med for period of time. ?Hold Statin for 1-2 weeks, see response. ?May continue zyrtec for now phase off if not needed ?If statin was  cause, we can consider alternative cholesterol medication Zetia etc. ?If statin not cause of rash, would restart statin, and consider Allergist / Singulair etc ? ?L elbow epicondylitis ?Likely repetitive activities motion likely bursitis flares vs tendonitis ?Recommendations per AVS, RICE therapy, forearm strap, limit repetitive strain. Topical NSAID ? ?GERD ?Controled, now only PRN PPI '20mg'$  Omeprazole. Not regularly. Updated his chart. ? ?Meds ordered this encounter  ?Medications  ? omeprazole (PRILOSEC) 20 MG capsule  ?  Sig: Take 1 capsule (20 mg total) by mouth daily as needed.  ?  Dispense:  30 capsule  ?  Refill:  1  ? ? ? ? ?Follow up plan: ?Return if symptoms worsen or fail to improve. ? ?[name], DO ?[facility] ?[facility] ?08/15/2021, 10:42 AM ?

## 2021-08-15 NOTE — Patient Instructions (Addendum)
Thank you for coming to the office today. ? ?Start with trial off Rosuvastatin for about 1-2 weeks, and remain on the Zyrtec for now, and we'll see the verdict. If no further hives off statin, then we have our answer. And can phase off Zyrtec. May consider alternative cholesterol medications - can try OTHER statin, or Zetia, or Nexletol (newer med). ? ?If you do get hives again off of it, we can restart and then reconsider alternatives. ? ?May add a Singulair montelukast '10mg'$  daily alternative / additional allergy blockade ? ?May pursue Allergy referral for more advanced testing and assisting with diagnosis. ? ?------- ? ?You most likely have "Tennis Elbow" or "Lateral Epicondylitis" ?- This is inflammation or tendonitis affecting the muscles of your forearm ?- Usually it is caused by frequent or daily repetitive activities using your arms, lifting, rotating, pulling, twisting - it can happen over days to months or longer from repetitive strain ? ?One of the most important parts of treatment is rest and avoiding the activities that make it worse. ? ?START anti inflammatory topical - OTC Voltaren (generic Diclofenac) topical 2-4 times a day as needed for pain swelling of affected joint for 1-2 weeks or longer. ? ?Alternatively may use Aleve instead. ? ?- DO NOT TAKE any ibuprofen, aleve, motrin while you are taking this medicine ?- It is safe to take Tylenol Ext Str '500mg'$  tabs - take 1 to 2 (max dose '1000mg'$ ) every 6 hours as needed for breakthrough pain, max 24 hour daily dose is 6 to 8 tablets or '4000mg'$  ? ?Use RICE therapy: ?- R - Rest / relative rest with activity modification avoid overuse of joint ?- I - Ice packs (make sure you use a towel or sock / something to protect skin) ?- C - Compression with ACE wrap to apply pressure and reduce swelling allowing more support ? ?Try a Forearm Tennis Elbow Strap over muscle as demonstrated - use with repetitive activity to reduce strain of muscle ? ? ? ?- E - Elevation -  if significant swelling, lift leg above heart level (toes above your nose) to help reduce swelling, most helpful at night after day of being on your feet ? ?May consider Physical Therapy referral if interested ? ? ? ?Please schedule a Follow-up Appointment to: Return if symptoms worsen or fail to improve. ? ?If you have any other questions or concerns, please feel free to call the office or send a message through [place]. You may also schedule an earlier appointment if necessary. ? ?Additionally, you may be receiving a survey about your experience at our office within a few days to 1 week by e-mail or mail. We value your feedback. ? ?[name], DO ?[place] ?

## 2021-08-21 ENCOUNTER — Encounter: Payer: Self-pay | Admitting: Family Medicine

## 2021-09-01 ENCOUNTER — Encounter (INDEPENDENT_AMBULATORY_CARE_PROVIDER_SITE_OTHER): Payer: 59 | Admitting: Family Medicine

## 2021-09-01 DIAGNOSIS — L509 Urticaria, unspecified: Secondary | ICD-10-CM | POA: Diagnosis not present

## 2021-09-07 MED ORDER — MONTELUKAST SODIUM 10 MG PO TABS: 10.0000 mg | ORAL_TABLET | Freq: Every day | ORAL | 3 refills | Status: DC

## 2021-09-07 NOTE — Telephone Encounter (Signed)
Please see the MyChart message reply(ies) for my assessment and plan.    This patient gave consent for this Medical Advice Message and is aware that it may result in a bill to their insurance company, as well as the possibility of receiving a bill for a co-payment or deductible. They are an established patient, but are not seeking medical advice exclusively about a problem treated during an in person or video visit in the last seven days. I did not recommend an in person or video visit within seven days of my reply.    I spent a total of 6 minutes cumulative time within 7 days through MyChart messaging.  Braylynn Lewing, DO   

## 2021-09-07 NOTE — Addendum Note (Signed)
Addended by: Olin Hauser on: 09/07/2021 06:58 PM   Modules accepted: Orders

## 2021-09-10 NOTE — Addendum Note (Signed)
Addended by: Olin Hauser on: 09/10/2021 11:10 AM   Modules accepted: Orders

## 2021-09-15 ENCOUNTER — Encounter: Payer: Self-pay | Admitting: Family Medicine

## 2021-09-15 DIAGNOSIS — L509 Urticaria, unspecified: Secondary | ICD-10-CM

## 2021-09-17 MED ORDER — CETIRIZINE HCL 10 MG PO TABS: 10.0000 mg | ORAL_TABLET | Freq: Two times a day (BID) | ORAL | 3 refills | Status: DC

## 2021-09-17 MED ORDER — FAMOTIDINE 20 MG PO TABS: 20.0000 mg | ORAL_TABLET | Freq: Two times a day (BID) | ORAL | 3 refills | Status: DC

## 2021-09-21 ENCOUNTER — Other Ambulatory Visit: Payer: Self-pay | Admitting: Family Medicine

## 2021-09-21 DIAGNOSIS — E7800 Pure hypercholesterolemia, unspecified: Secondary | ICD-10-CM

## 2021-09-21 NOTE — Telephone Encounter (Signed)
Requested Prescriptions  Pending Prescriptions Disp Refills  . rosuvastatin (CRESTOR) 5 MG tablet [Pharmacy Med Name: Rosuvastatin Calcium 5 MG Oral Tablet] 90 tablet 3    Sig: TAKE 1 TABLET BY MOUTH AT  BEDTIME     Cardiovascular:  Antilipid - Statins 2 Failed - 09/21/2021 12:54 AM      Failed - Lipid Panel in normal range within the last 12 months    Cholesterol  Date Value Ref Range Status  10/10/2020 194 <200 mg/dL Final   LDL Cholesterol (Calc)  Date Value Ref Range Status  10/10/2020 97 mg/dL (calc) Final    Comment:    Reference range: <100 . Desirable range <100 mg/dL for primary prevention;   <70 mg/dL for patients with CHD or diabetic patients  with > or = 2 CHD risk factors. Marland Kitchen LDL-C is now calculated using the Martin-Hopkins  calculation, which is a validated novel method providing  better accuracy than the Friedewald equation in the  estimation of LDL-C.  Cresenciano Genre et al. Annamaria Helling. 1975;883(25): 2061-2068  (http://education.[facility].com/faq/FAQ164)    HDL  Date Value Ref Range Status  10/10/2020 75 > OR = 40 mg/dL Final   Triglycerides  Date Value Ref Range Status  10/10/2020 123 <150 mg/dL Final         Passed - Cr in normal range and within 360 days    Creat  Date Value Ref Range Status  10/10/2020 0.90 0.70 - 1.33 mg/dL Final    Comment:    For patients >49 years of age, the reference limit for Creatinine is approximately 13% higher for people identified as African-American. Renella Cunas - Patient is not pregnant      Passed - Valid encounter within last 12 months    Recent Outpatient Visits          1 month ago Urticarial rash   [place], [place]   11 months ago Annual physical exam   [place], [place]   1 year ago Acute left-sided low back pain without sciatica   [facility] Olin Hauser, DO   1 year ago Pure  hypercholesterolemia   [place], [place]   2 years ago Annual physical exam   [facility] Olin Hauser, DO      Future Appointments            In 1 month [place], Devonne Doughty, [facility], [facility]

## 2021-09-24 ENCOUNTER — Encounter (INDEPENDENT_AMBULATORY_CARE_PROVIDER_SITE_OTHER): Payer: 59 | Admitting: Family Medicine

## 2021-09-24 DIAGNOSIS — L509 Urticaria, unspecified: Secondary | ICD-10-CM

## 2021-09-24 DIAGNOSIS — T7840XS Allergy, unspecified, sequela: Secondary | ICD-10-CM

## 2021-09-24 DIAGNOSIS — Z91018 Allergy to other foods: Secondary | ICD-10-CM

## 2021-09-25 MED ORDER — PREDNISONE 10 MG PO TABS: ORAL_TABLET | ORAL | 0 refills | Status: DC

## 2021-09-25 MED ORDER — EPINEPHRINE 0.3 MG/0.3ML IJ SOAJ: 0.3000 mg | INTRAMUSCULAR | 1 refills | Status: DC | PRN

## 2021-09-25 NOTE — Telephone Encounter (Signed)
Please see the MyChart message reply(ies) for my assessment and plan.    This patient gave consent for this Medical Advice Message and is aware that it may result in a bill to [employer], as well as the possibility of receiving a bill for a co-payment or deductible. They are an established patient, but are not seeking medical advice exclusively about a problem treated during an in person or video visit in the last seven days. I did not recommend an in person or video visit within seven days of my reply.    I spent a total of 15 minutes cumulative time within 7 days through [employer].  [name], DO

## 2021-09-26 ENCOUNTER — Other Ambulatory Visit: Payer: Self-pay

## 2021-09-26 ENCOUNTER — Other Ambulatory Visit: Payer: 59

## 2021-09-26 DIAGNOSIS — Z91018 Allergy to other foods: Secondary | ICD-10-CM

## 2021-09-26 DIAGNOSIS — L509 Urticaria, unspecified: Secondary | ICD-10-CM

## 2021-09-27 ENCOUNTER — Encounter: Payer: 59 | Admitting: Dermatology

## 2021-09-29 LAB — ALPHA-GAL PANEL
Allergen, Mutton, f88: <0.1 kU/L
Allergen, Pork, f26: <0.1 kU/L
Beef: 0.14 kU/L — ABNORMAL HIGH
CLASS: 0
Class: 0
GALACTOSE-ALPHA-1,3-GALACTOSE IGE*: 0.3 kU/L — ABNORMAL HIGH (ref <0.10)

## 2021-09-29 LAB — INTERPRETATION:

## 2021-10-12 ENCOUNTER — Other Ambulatory Visit: Payer: Self-pay

## 2021-10-12 DIAGNOSIS — Z0000 Encounter for general adult medical examination without abnormal findings: Secondary | ICD-10-CM

## 2021-10-12 DIAGNOSIS — E7800 Pure hypercholesterolemia, unspecified: Secondary | ICD-10-CM

## 2021-10-12 DIAGNOSIS — Z125 Encounter for screening for malignant neoplasm of prostate: Secondary | ICD-10-CM

## 2021-10-12 DIAGNOSIS — R7309 Other abnormal glucose: Secondary | ICD-10-CM

## 2021-10-18 ENCOUNTER — Other Ambulatory Visit: Payer: 59

## 2021-10-18 DIAGNOSIS — R7309 Other abnormal glucose: Secondary | ICD-10-CM

## 2021-10-18 DIAGNOSIS — E7800 Pure hypercholesterolemia, unspecified: Secondary | ICD-10-CM

## 2021-10-18 DIAGNOSIS — Z125 Encounter for screening for malignant neoplasm of prostate: Secondary | ICD-10-CM

## 2021-10-18 DIAGNOSIS — Z0000 Encounter for general adult medical examination without abnormal findings: Secondary | ICD-10-CM

## 2021-10-19 LAB — CBC WITH DIFFERENTIAL/PLATELET
Absolute Monocytes: 392 cells/uL (ref 200–950)
Basophils Absolute: 0 cells/uL (ref 0–200)
Basophils Relative: 0 %
Eosinophils Absolute: 102 cells/uL (ref 15–500)
Eosinophils Relative: 2.9 %
HCT: 42.5 % (ref 38.5–50.0)
Hemoglobin: 14 g/dL (ref 13.2–17.1)
Lymphs Abs: 1299 cells/uL (ref 850–3900)
MCH: 31.3 pg (ref 27.0–33.0)
MCHC: 32.9 g/dL (ref 32.0–36.0)
MCV: 94.9 fL (ref 80.0–100.0)
MPV: 12 fL (ref 7.5–12.5)
Monocytes Relative: 11.2 %
Neutro Abs: 1708 cells/uL (ref 1500–7800)
Neutrophils Relative %: 48.8 %
Platelets: 142 10*3/uL (ref 140–400)
RBC: 4.48 10*6/uL (ref 4.20–5.80)
RDW: 12.5 % (ref 11.0–15.0)
Total Lymphocyte: 37.1 %
WBC: 3.5 10*3/uL — ABNORMAL LOW (ref 3.8–10.8)

## 2021-10-19 LAB — COMPREHENSIVE METABOLIC PANEL
AG Ratio: 2.2 (calc) (ref 1.0–2.5)
ALT: 14 U/L (ref 9–46)
AST: 14 U/L (ref 10–35)
Albumin: 4.3 g/dL (ref 3.6–5.1)
Alkaline phosphatase (APISO): 53 U/L (ref 35–144)
BUN: 15 mg/dL (ref 7–25)
CO2: 28 mmol/L (ref 20–32)
Calcium: 9 mg/dL (ref 8.6–10.3)
Chloride: 103 mmol/L (ref 98–110)
Creat: 0.96 mg/dL (ref 0.70–1.35)
Globulin: 2 g/dL (calc) (ref 1.9–3.7)
Glucose, Bld: 101 mg/dL — ABNORMAL HIGH (ref 65–99)
Potassium: 4.7 mmol/L (ref 3.5–5.3)
Sodium: 138 mmol/L (ref 135–146)
Total Bilirubin: 0.5 mg/dL (ref 0.2–1.2)
Total Protein: 6.3 g/dL (ref 6.1–8.1)

## 2021-10-19 LAB — TSH: TSH: 1.93 mIU/L (ref 0.40–4.50)

## 2021-10-19 LAB — LIPID PANEL
Cholesterol: 195 mg/dL (ref <200)
HDL: 53 mg/dL (ref >=40)
LDL Cholesterol (Calc): 120 mg/dL (calc) — ABNORMAL HIGH
Non-HDL Cholesterol (Calc): 142 mg/dL (calc) — ABNORMAL HIGH (ref <130)
Total CHOL/HDL Ratio: 3.7 (calc) (ref <5.0)
Triglycerides: 110 mg/dL (ref <150)

## 2021-10-19 LAB — HEMOGLOBIN A1C
Hgb A1c MFr Bld: 5.5 % of total Hgb (ref <5.7)
Mean Plasma Glucose: 111 mg/dL
eAG (mmol/L): 6.2 mmol/L

## 2021-10-19 LAB — PSA: PSA: 1.84 ng/mL (ref <=4.00)

## 2021-10-23 ENCOUNTER — Other Ambulatory Visit: Payer: Self-pay | Admitting: Family Medicine

## 2021-10-23 DIAGNOSIS — N529 Male erectile dysfunction, unspecified: Secondary | ICD-10-CM

## 2021-10-24 NOTE — Telephone Encounter (Signed)
Requested Prescriptions  Pending Prescriptions Disp Refills  . tadalafil (CIALIS) 5 MG tablet [Pharmacy Med Name: Tadalafil 5 MG Oral Tablet] 90 tablet 0    Sig: TAKE 1 TABLET BY MOUTH  DAILY     Urology: Erectile Dysfunction Agents Passed - 10/23/2021 11:26 PM      Passed - AST in normal range and within 360 days    AST  Date Value Ref Range Status  10/18/2021 14 10 - 35 U/L Final         Passed - ALT in normal range and within 360 days    ALT  Date Value Ref Range Status  10/18/2021 14 9 - 46 U/L Final         Passed - Last BP in normal range    BP Readings from Last 1 Encounters:  08/15/21 132/88         Passed - Valid encounter within last 12 months    Recent Outpatient Visits          2 months ago Urticarial rash   [place], DO   1 year ago Annual physical exam   [place], DO   1 year ago Acute left-sided low back pain without sciatica   [facility] Olin Hauser, DO   1 year ago Pure hypercholesterolemia   [place], DO   2 years ago Annual physical exam   [place], Devonne Doughty, DO      Future Appointments            Tomorrow [place], Devonne Doughty, [facility], [facility]

## 2021-10-25 ENCOUNTER — Other Ambulatory Visit: Payer: Self-pay | Admitting: Family Medicine

## 2021-10-25 ENCOUNTER — Encounter: Payer: Self-pay | Admitting: Family Medicine

## 2021-10-25 ENCOUNTER — Ambulatory Visit (INDEPENDENT_AMBULATORY_CARE_PROVIDER_SITE_OTHER): Payer: 59 | Admitting: Family Medicine

## 2021-10-25 VITALS — BP 138/75 | HR 59 | Ht 72.0 in | Wt 191.2 lb

## 2021-10-25 DIAGNOSIS — Z0000 Encounter for general adult medical examination without abnormal findings: Secondary | ICD-10-CM | POA: Diagnosis not present

## 2021-10-25 DIAGNOSIS — R972 Elevated prostate specific antigen [PSA]: Secondary | ICD-10-CM

## 2021-10-25 DIAGNOSIS — I10 Essential (primary) hypertension: Secondary | ICD-10-CM | POA: Diagnosis not present

## 2021-10-25 DIAGNOSIS — E7800 Pure hypercholesterolemia, unspecified: Secondary | ICD-10-CM | POA: Diagnosis not present

## 2021-10-25 MED ORDER — AMLODIPINE BESYLATE 5 MG PO TABS: 5.0000 mg | ORAL_TABLET | Freq: Every day | ORAL | 3 refills | Status: DC

## 2021-10-25 NOTE — Assessment & Plan Note (Signed)
Well-controlled HTN - Home BP readings normal  No known complications   Plan:  1. Discussed change of therapy, off beta blocker concern of interaction and side effect - will taper off Bystolic trial for 2 weeks, down to '10mg'$  for 1 week then '5mg'$  for 1 week then off and start CCB Amlodipine '5mg'$  daily, may adjust dose accordingly 2. Encourage improved lifestyle - low sodium diet, regular exercise 3. Continue monitor BP outside office, bring readings to next visit, if persistently >140/90 or new symptoms notify office sooner

## 2021-10-25 NOTE — Progress Notes (Signed)
Subjective:    Patient ID: Lucas Lopez, male    DOB: [birth year], 61 y.o.   MRN: [id number]  Lucas Lopez is a 61 y.o. male presenting on 10/25/2021 for Annual Exam   HPI   Here Annual Physical and Lab Review.   Lifestyle / Hyperlipidemia / BMI >25   Reviewed BP readings avg 110-120s, high 130. HR 40-60   Continues regular exercise, cycling Overall he is doing well, balanced diet and regular exercise Maternal history T2DM, last A1c 5.5   Cholesterol  previously controlled on low dose Rosuvastatin '5mg'$  for cardiovascular risk reduction Known fam history of Hyperlipidemia, heart disease, mother massive MI age 31  Last lab showed elevated LDL >120 off med for about 1 week. Due to concern of medication reaction side effect  IBS symptoms have mostly resolved. Seems dairy was related.   Hypertension Taking Bystolic '15mg'$  daily '5mg'$  x 3  Question if Bystolic is contributing to hyperactive reactions, NSAID, Tadalafil, Statin? If related to histamine impact due to Bystolic  Asking for other BP medication. He had ACEi before that caused cough.      Chronic trace proteinuria Today U/A trace protein, long term history of trace protein in urine. Never had further testing.   Health Maintenance:   Prostate CA Screening: Prior PSA / DRE reported normal. Last PSA up to 1.84 (2023) previous 0.87 (10/2020) negative. Currently asymptomatic. No known family history of prostate CA.  Some increased trend. No significant LUTS   Colon CA Screening: Last Colonoscopy 07/07/19 (done by [facility] Dwaine Deter MD), results with 1 pre-cancerous polyp benign, and internal hemorrhoid, diverticula, repeat in 5 years recommended scanned to chart. No known family history of colon CA. Next due 5 years, or 2026   Falls per year 6, occasional stumble and fall while in woods, only with tripping, not having actual regular unprompted falls.     10/25/2021    9:16 AM 08/15/2021   10:42 AM 10/17/2020    1:27  PM  Depression screen PHQ 2/9  Decreased Interest 0 0 0  Down, Depressed, Hopeless 0 0 0  PHQ - 2 Score 0 0 0  Altered sleeping 1 1 0  Tired, decreased energy '1 1 1  '$ Change in appetite '1 1 2  '$ Feeling bad or failure about yourself  0 0 0  Trouble concentrating 0 0 0  Moving slowly or fidgety/restless 0 0 0  Suicidal thoughts 0 0 0  PHQ-9 Score '3 3 3  '$ Difficult doing work/chores Not difficult at all Not difficult at all Not difficult at all    Past Medical History:  Diagnosis Date   Diverticulitis    Eczema    Gout    In the left most 3 toes of left foot   Vitiligo    History reviewed. No pertinent surgical history. Social History   Socioeconomic History   Marital status: Married    Spouse name: Abdulaziz Toman   Number of children: Not on file   Years of education: Not on file   Highest education level: Not on file  Occupational History   Occupation: Scientist, forensic    Comment: (retiring in Summer 2021)  Tobacco Use   Smoking status: Former   Smokeless tobacco: Former  Substance and Sexual Activity   Alcohol use: Not Currently   Drug use: Not Currently    Types: Marijuana   Sexual activity: Not on file  Other Topics Concern   Not on file  Social History Narrative  Not on file   Social Determinants of Health   Financial Resource Strain: Not on file  Food Insecurity: Not on file  Transportation Needs: Not on file  Physical Activity: Not on file  Stress: Not on file  Social Connections: Not on file  Intimate Partner Violence: Not on file   Family History  Problem Relation Age of Onset   Hypertension Mother    Heart attack Mother 48   Fibroids Mother    Pneumonia Father 48   Hyperlipidemia Father    Hypertension Father    Osteoarthritis Father    Psoriasis Sister    Skin cancer Sister    Current Outpatient Medications on File Prior to Visit  Medication Sig   cetirizine (ZYRTEC) 10 MG tablet Take 1 tablet (10 mg total) by mouth 2 (two) times daily.    clotrimazole-betamethasone (LOTRISONE) cream Apply 1 application topically 2 (two) times daily.   cromolyn (NASALCROM) 5.2 MG/ACT nasal spray Place 1 spray into both nostrils daily as needed.   EPINEPHrine (EPIPEN 2-PAK) 0.3 mg/0.3 mL IJ SOAJ injection Inject 0.3 mg into the muscle as needed for anaphylaxis.   omeprazole (PRILOSEC) 20 MG capsule Take 1 capsule (20 mg total) by mouth daily as needed.   Polypodium Leucotomos (HELIOCARE PO) Take by mouth.   No current facility-administered medications on file prior to visit.    Review of Systems  Constitutional:  Negative for activity change, appetite change, chills, diaphoresis, fatigue and fever.  HENT:  Negative for congestion and hearing loss.   Eyes:  Negative for visual disturbance.  Respiratory:  Negative for cough, chest tightness, shortness of breath and wheezing.   Cardiovascular:  Negative for chest pain, palpitations and leg swelling.  Gastrointestinal:  Negative for abdominal pain, constipation, diarrhea, nausea and vomiting.  Genitourinary:  Negative for dysuria, frequency and hematuria.  Musculoskeletal:  Negative for arthralgias and neck pain.  Skin:  Negative for rash.  Neurological:  Negative for dizziness, weakness, light-headedness, numbness and headaches.  Hematological:  Negative for adenopathy.  Psychiatric/Behavioral:  Negative for behavioral problems, dysphoric mood and sleep disturbance.    Per HPI unless specifically indicated above     Objective:    BP 138/75   Pulse (!) 59   Ht 6' (1.829 m)   Wt 191 lb 3.2 oz (86.7 kg)   SpO2 98%   BMI 25.93 kg/m   Wt Readings from Last 3 Encounters:  10/25/21 191 lb 3.2 oz (86.7 kg)  08/15/21 194 lb 9.6 oz (88.3 kg)  01/18/21 182 lb (82.6 kg)    Physical Exam Vitals and nursing note reviewed.  Constitutional:      General: He is not in acute distress.    Appearance: He is well-developed. He is not diaphoretic.     Comments: Well-appearing, comfortable,  cooperative  HENT:     Head: Normocephalic and atraumatic.  Eyes:     General:        Right eye: No discharge.        Left eye: No discharge.     Conjunctiva/sclera: Conjunctivae normal.     Pupils: Pupils are equal, round, and reactive to light.  Neck:     Thyroid: No thyromegaly.     Vascular: No carotid bruit.  Cardiovascular:     Rate and Rhythm: Normal rate and regular rhythm.     Pulses: Normal pulses.     Heart sounds: Normal heart sounds. No murmur heard. Pulmonary:     Effort: Pulmonary effort is normal.  No respiratory distress.     Breath sounds: Normal breath sounds. No wheezing or rales.  Abdominal:     General: Bowel sounds are normal. There is no distension.     Palpations: Abdomen is soft. There is no mass.     Tenderness: There is no abdominal tenderness.  Musculoskeletal:        General: No tenderness. Normal range of motion.     Cervical back: Normal range of motion and neck supple.     Right lower leg: No edema.     Left lower leg: No edema.     Comments: Upper / Lower Extremities: - Normal muscle tone, strength bilateral upper extremities 5/5, lower extremities 5/5  Lymphadenopathy:     Cervical: No cervical adenopathy.  Skin:    General: Skin is warm and dry.     Findings: No erythema or rash.  Neurological:     Mental Status: He is alert and oriented to person, place, and time.     Comments: Distal sensation intact to light touch all extremities  Psychiatric:        Mood and Affect: Mood normal.        Behavior: Behavior normal.        Thought Content: Thought content normal.     Comments: Well groomed, good eye contact, normal speech and thoughts    Results for orders placed or performed in visit on 10/18/21  CBC with Differential  Result Value Ref Range   WBC 3.5 (L) 3.8 - 10.8 Thousand/uL   RBC 4.48 4.20 - 5.80 Million/uL   Hemoglobin 14.0 13.2 - 17.1 g/dL   HCT 42.5 38.5 - 50.0 %   MCV 94.9 80.0 - 100.0 fL   MCH 31.3 27.0 - 33.0 pg    MCHC 32.9 32.0 - 36.0 g/dL   RDW 12.5 11.0 - 15.0 %   Platelets 142 140 - 400 Thousand/uL   MPV 12.0 7.5 - 12.5 fL   Neutro Abs 1,708 1,500 - 7,800 cells/uL   Lymphs Abs 1,299 850 - 3,900 cells/uL   Absolute Monocytes 392 200 - 950 cells/uL   Eosinophils Absolute 102 15 - 500 cells/uL   Basophils Absolute 0 0 - 200 cells/uL   Neutrophils Relative % 48.8 %   Total Lymphocyte 37.1 %   Monocytes Relative 11.2 %   Eosinophils Relative 2.9 %   Basophils Relative 0.0 %  Lipid panel  Result Value Ref Range   Cholesterol 195 <200 mg/dL   HDL 53 > OR = 40 mg/dL   Triglycerides 110 <150 mg/dL   LDL Cholesterol (Calc) 120 (H) mg/dL (calc)   Total CHOL/HDL Ratio 3.7 <5.0 (calc)   Non-HDL Cholesterol (Calc) 142 (H) <130 mg/dL (calc)  HgB A1c  Result Value Ref Range   Hgb A1c MFr Bld 5.5 <5.7 % of total Hgb   Mean Plasma Glucose 111 mg/dL   eAG (mmol/L) 6.2 mmol/L  PSA  Result Value Ref Range   PSA 1.84 < OR = 4.00 ng/mL  Comprehensive Metabolic Panel (CMET)  Result Value Ref Range   Glucose, Bld 101 (H) 65 - 99 mg/dL   BUN 15 7 - 25 mg/dL   Creat 0.96 0.70 - 1.35 mg/dL   BUN/Creatinine Ratio NOT APPLICABLE 6 - 22 (calc)   Sodium 138 135 - 146 mmol/L   Potassium 4.7 3.5 - 5.3 mmol/L   Chloride 103 98 - 110 mmol/L   CO2 28 20 - 32 mmol/L   Calcium 9.0 8.6 -  10.3 mg/dL   Total Protein 6.3 6.1 - 8.1 g/dL   Albumin 4.3 3.6 - 5.1 g/dL   Globulin 2.0 1.9 - 3.7 g/dL (calc)   AG Ratio 2.2 1.0 - 2.5 (calc)   Total Bilirubin 0.5 0.2 - 1.2 mg/dL   Alkaline phosphatase (APISO) 53 35 - 144 U/L   AST 14 10 - 35 U/L   ALT 14 9 - 46 U/L  TSH  Result Value Ref Range   TSH 1.93 0.40 - 4.50 mIU/L      Assessment & Plan:   Problem List Items Addressed This Visit     Essential hypertension    Well-controlled HTN - Home BP readings normal  No known complications   Plan:  1. Discussed change of therapy, off beta blocker concern of interaction and side effect - will taper off Bystolic  trial for 2 weeks, down to '10mg'$  for 1 week then '5mg'$  for 1 week then off and start CCB Amlodipine '5mg'$  daily, may adjust dose accordingly 2. Encourage improved lifestyle - low sodium diet, regular exercise 3. Continue monitor BP outside office, bring readings to next visit, if persistently >140/90 or new symptoms notify office sooner      Relevant Medications   amLODipine (NORVASC) 5 MG tablet   Pure hypercholesterolemia    Elevated LDL >120 off statin now Question if side effect on statin or allergic hypersensitivity No side effect and normal LFTs, only slight elevated ALT still normal range  Prior adv lipid panel done, see prior results Calculated ASCVD 10 yr risk score 7.5%, has fam history MI age 91s  Plan: 1. May resume Rosuvastatin '5mg'$  in future when off Bystolic if determined that was causing interaction 2 Encourage improved lifestyle - low carb/cholesterol, reduce portion size, continue improving regular exercise      Relevant Medications   amLODipine (NORVASC) 5 MG tablet   Other Visit Diagnoses     Annual physical exam    -  Primary       Updated Health Maintenance information Reviewed recent lab results with patient Encouraged improvement to lifestyle with diet and exercise Goal maintain healthy weight  Future consider Lung Screening CT vs CXR and can pursue other imaging.  Mild elevated PSA now 0.8 up to 1.8, trajectory up by 1 point in 1 year. Will repeat In 6 months.  Meds ordered this encounter  Medications   amLODipine (NORVASC) 5 MG tablet    Sig: Take 1 tablet (5 mg total) by mouth daily.    Dispense:  90 tablet    Refill:  3     Follow up plan: Return in about 6 months (around 04/27/2022) for Follow-up 4-6 weeks virtual for BP, allergy, and also 6 month from today blood for PSA.  PSA repeat 6 months  [name], [facility] 10/25/2021, 9:19 AM

## 2021-10-25 NOTE — Patient Instructions (Addendum)
Thank you for coming to the office today.  Lung Screen options  We can do basic Chest X-ray here for cough / former smoker. If it finds any abnormality, then you may be eligible for a covered CT Scan.  Or  Routine route for lung cancer screening would be as follows  Pulmonary Nodule / Screening Clinic Low Dose Chest CT Lung CA Screening - Age 60 to 75 - Smoking history >20 pack years - Annual screening for 15 years after quit date  -------------------------------------  Taper off Bystolic From 5 mg 3 times a down down to 2 tabs or '5mg'$  twice a day for 1 week, then down to '5mg'$  once daily for 1 week, then stop and switch to Amlodipine '5mg'$  daily.  Caution mild swelling lower extremities due to the Amlodipine. Elevation is the main treatment.  DUE for NON FASTING BLOOD WORK   SCHEDULE "Lab Only" visit in the morning at the clinic for lab draw in 6 MONTHS   - Make sure Lab Only appointment is at about 1 week before your next appointment, so that results will be available  For Lab Results, once available within 2-3 days of blood draw, you can can log in to MyChart online to view your results and a brief explanation. Also, we can discuss results at next follow-up visit.    Please schedule a Follow-up Appointment to: Return in about 6 months (around 04/27/2022) for Follow-up 4-6 weeks virtual for BP, allergy, and also 6 month from today blood for PSA.  If you have any other questions or concerns, please feel free to call the office or send a message through [place]. You may also schedule an earlier appointment if necessary.  Additionally, you may be receiving a survey about your experience at our office within a few days to 1 week by e-mail or mail. We value your feedback.  [name], DO [place]

## 2021-10-25 NOTE — Assessment & Plan Note (Signed)
Elevated LDL >120 off statin now Question if side effect on statin or allergic hypersensitivity No side effect and normal LFTs, only slight elevated ALT still normal range  Prior adv lipid panel done, see prior results Calculated ASCVD 10 yr risk score 7.5%, has fam history MI age 30s  Plan: 1. May resume Rosuvastatin '5mg'$  in future when off Bystolic if determined that was causing interaction 2 Encourage improved lifestyle - low carb/cholesterol, reduce portion size, continue improving regular exercise

## 2021-11-08 ENCOUNTER — Ambulatory Visit (INDEPENDENT_AMBULATORY_CARE_PROVIDER_SITE_OTHER): Payer: 59 | Admitting: Dermatology

## 2021-11-08 DIAGNOSIS — L509 Urticaria, unspecified: Secondary | ICD-10-CM | POA: Diagnosis not present

## 2021-11-08 DIAGNOSIS — L821 Other seborrheic keratosis: Secondary | ICD-10-CM

## 2021-11-08 DIAGNOSIS — L578 Other skin changes due to chronic exposure to nonionizing radiation: Secondary | ICD-10-CM

## 2021-11-08 DIAGNOSIS — Z1283 Encounter for screening for malignant neoplasm of skin: Secondary | ICD-10-CM | POA: Diagnosis not present

## 2021-11-08 DIAGNOSIS — L814 Other melanin hyperpigmentation: Secondary | ICD-10-CM

## 2021-11-08 DIAGNOSIS — D1800 Hemangioma unspecified site: Secondary | ICD-10-CM

## 2021-11-08 DIAGNOSIS — D229 Melanocytic nevi, unspecified: Secondary | ICD-10-CM

## 2021-11-08 NOTE — Progress Notes (Signed)
   Follow-Up Visit   Subjective  Lucas Lopez is a 61 y.o. male who presents for the following: Annual Exam (The patient presents for Total-Body Skin Exam (TBSE) for skin cancer screening and mole check.  The patient has spots, moles and lesions to be evaluated, some may be new or changing and the patient has concerns that these could be cancer. Patient with no hx of skin cancer. Patient does have hx of vitiligo but does not treat. He does have a spot at right temple that comes and goes. ).  Family history of skin cancer  - what type(s): unknown  - who affected: sister  The following portions of the chart were reviewed this encounter and updated as appropriate:   Tobacco  Allergies  Meds  Problems  Med Hx  Surg Hx  Fam Hx      Review of Systems:  No other skin or systemic complaints except as noted in HPI or Assessment and Plan.  Objective  Well appearing patient in no apparent distress; mood and affect are within normal limits.  A full examination was performed including scalp, head, eyes, ears, nose, lips, neck, chest, axillae, abdomen, back, buttocks, bilateral upper extremities, bilateral lower extremities, hands, feet, fingers, toes, fingernails, and toenails. All findings within normal limits unless otherwise noted below.  trunk, extremities Clear today    Assessment & Plan  Hives trunk, extremities  Patient followed by PCP and doing well currently   Lentigines - Scattered tan macules - Due to sun exposure - Benign-appearing, observe - Recommend daily broad spectrum sunscreen SPF 30+ to sun-exposed areas, reapply every 2 hours as needed. - Call for any changes  Seborrheic Keratoses - Stuck-on, waxy, tan-brown papules and/or plaques  - Benign-appearing - Discussed benign etiology and prognosis. - Observe - Call for any changes  Melanocytic Nevi - Tan-brown and/or pink-flesh-colored symmetric macules and papules - Benign appearing on exam today -  Observation - Call clinic for new or changing moles - Recommend daily use of broad spectrum spf 30+ sunscreen to sun-exposed areas.   Hemangiomas - Red papules - Discussed benign nature - Observe - Call for any changes  Actinic Damage - Chronic condition, secondary to cumulative UV/sun exposure - diffuse scaly erythematous macules with underlying dyspigmentation - Recommend daily broad spectrum sunscreen SPF 30+ to sun-exposed areas, reapply every 2 hours as needed.  - Staying in the shade or wearing long sleeves, sun glasses (UVA+UVB protection) and wide brim hats (4-inch brim around the entire circumference of the hat) are also recommended for sun protection.  - Call for new or changing lesions.  Skin cancer screening performed today.  Return in about 1 year (around 11/09/2022) for TBSE.  Graciella Belton, RMA, am acting as scribe for Forest Gleason, MD .  Documentation: I have reviewed the above documentation for accuracy and completeness, and I agree with the above.  Forest Gleason, MD

## 2021-11-08 NOTE — Patient Instructions (Signed)
Recommend taking Heliocare sun protection supplement daily in sunny weather for additional sun protection. For maximum protection on the sunniest days, you can take up to 2 capsules of regular Heliocare OR take 1 capsule of Heliocare Ultra. For prolonged exposure (such as a full day in the sun), you can repeat your dose of the supplement 4 hours after your first dose. Heliocare can be purchased at [facility], at some [facility] or at www.heliocare.com.    Melanoma ABCDEs  Melanoma is the most dangerous type of skin cancer, and is the leading cause of death from skin disease.  You are more likely to develop melanoma if you: Have light-colored skin, light-colored eyes, or red or blond hair Spend a lot of time in the sun Tan regularly, either outdoors or in a tanning bed Have had blistering sunburns, especially during childhood Have a close family member who has had a melanoma Have atypical moles or large birthmarks  Early detection of melanoma is key since treatment is typically straightforward and cure rates are extremely high if we catch it early.   The first sign of melanoma is often a change in a mole or a new dark spot.  The ABCDE system is a way of remembering the signs of melanoma.  A for asymmetry:  The two halves do not match. B for border:  The edges of the growth are irregular. C for color:  A mixture of colors are present instead of an even brown color. D for diameter:  Melanomas are usually (but not always) greater than 6mm - the size of a pencil eraser. E for evolution:  The spot keeps changing in size, shape, and color.  Please check your skin once per month between visits. You can use a small mirror in front and a large mirror behind you to keep an eye on the back side or your body.   If you see any new or changing lesions before your next follow-up, please call to schedule a visit.  Please continue daily skin protection including broad spectrum sunscreen SPF 30+ to  sun-exposed areas, reapplying every 2 hours as needed when you're outdoors.    Due to recent changes in healthcare laws, you may see results of your pathology and/or laboratory studies on MyChart before the doctors have had a chance to review them. We understand that in some cases there may be results that are confusing or concerning to you. Please understand that not all results are received at the same time and often the doctors may need to interpret multiple results in order to provide you with the best plan of care or course of treatment. Therefore, we ask that you please give us 2 business days to thoroughly review all your results before contacting the office for clarification. Should we see a critical lab result, you will be contacted sooner.   If You Need Anything After Your Visit  If you have any questions or concerns for your doctor, please call our main line at [phone] and press option 4 to reach your doctor's medical assistant. If no one answers, please leave a voicemail as directed and we will return your call as soon as possible. Messages left after 4 pm will be answered the following business day.   You may also send us a message via MyChart. We typically respond to MyChart messages within 1-2 business days.  For prescription refills, please ask your pharmacy to contact our office. Our fax number is [phone].  If you have   an urgent issue when the clinic is closed that cannot wait until the next business day, you can page your doctor at the number below.    Please note that while we do our best to be available for urgent issues outside of office hours, we are not available 24/7.   If you have an urgent issue and are unable to reach us, you may choose to seek medical care at your doctor's office, retail clinic, urgent care center, or emergency room.  If you have a medical emergency, please immediately call 911 or go to the emergency department.  Pager Numbers  - Dr.  Kowalski: [phone]  - Dr. Moye: [phone]  - Dr. Stewart: [phone]  In the event of inclement weather, please call our main line at [phone] for an update on the status of any delays or closures.  Dermatology Medication Tips: Please keep the boxes that topical medications come in in order to help keep track of the instructions about where and how to use these. Pharmacies typically print the medication instructions only on the boxes and not directly on the medication tubes.   If your medication is too expensive, please contact our office at [phone] option 4 or send us a message through MyChart.   We are unable to tell what your co-pay for medications will be in advance as this is different depending on your insurance coverage. However, we may be able to find a substitute medication at lower cost or fill out paperwork to get insurance to cover a needed medication.   If a prior authorization is required to get your medication covered by your insurance company, please allow us 1-2 business days to complete this process.  Drug prices often vary depending on where the prescription is filled and some pharmacies may offer cheaper prices.  The website www.goodrx.com contains coupons for medications through different pharmacies. The prices here do not account for what the cost may be with help from insurance (it may be cheaper with your insurance), but the website can give you the price if you did not use any insurance.  - You can print the associated coupon and take it with your prescription to the pharmacy.  - You may also stop by our office during regular business hours and pick up a GoodRx coupon card.  - If you need your prescription sent electronically to a different pharmacy, notify our office through [facility] MyChart or by phone at [phone] option 4.     Si Usted Necesita Algo Despus de Su Visita  Tambin puede enviarnos un mensaje a travs de MyChart. Por lo  general respondemos a los mensajes de MyChart en el transcurso de 1 a 2 das hbiles.  Para renovar recetas, por favor pida a su farmacia que se ponga en contacto con nuestra oficina. Nuestro nmero de fax es el [phone].  Si tiene un asunto urgente cuando la clnica est cerrada y que no puede esperar hasta el siguiente da hbil, puede llamar/localizar a su doctor(a) al nmero que aparece a continuacin.   Por favor, tenga en cuenta que aunque hacemos todo lo posible para estar disponibles para asuntos urgentes fuera del horario de oficina, no estamos disponibles las 24 horas del da, los 7 das de la semana.   Si tiene un problema urgente y no puede comunicarse con nosotros, puede optar por buscar atencin mdica  en el consultorio de su doctor(a), en una clnica privada, en un centro de atencin urgente o en una sala de   emergencias.  Si tiene una emergencia mdica, por favor llame inmediatamente al 911 o vaya a la sala de emergencias.  Nmeros de bper  - Dr. Kowalski: [phone]  - Dra. Moye: [phone]  - Dra. Stewart: [phone]  En caso de inclemencias del tiempo, por favor llame a nuestra lnea principal al [phone] para una actualizacin sobre el estado de cualquier retraso o cierre.  Consejos para la medicacin en dermatologa: Por favor, guarde las cajas en las que vienen los medicamentos de uso tpico para ayudarle a seguir las instrucciones sobre dnde y cmo usarlos. Las farmacias generalmente imprimen las instrucciones del medicamento slo en las cajas y no directamente en los tubos del medicamento.   Si su medicamento es muy caro, por favor, pngase en contacto con nuestra oficina llamando al [phone] y presione la opcin 4 o envenos un mensaje a travs de MyChart.   No podemos decirle cul ser su copago por los medicamentos por adelantado ya que esto es diferente dependiendo de la cobertura de su seguro. Sin embargo, es posible que podamos encontrar un  medicamento sustituto a menor costo o llenar un formulario para que el seguro cubra el medicamento que se considera necesario.   Si se requiere una autorizacin previa para que su compaa de seguros cubra su medicamento, por favor permtanos de 1 a 2 das hbiles para completar este proceso.  Los precios de los medicamentos varan con frecuencia dependiendo del lugar de dnde se surte la receta y alguna farmacias pueden ofrecer precios ms baratos.  El sitio web www.goodrx.com tiene cupones para medicamentos de diferentes farmacias. Los precios aqu no tienen en cuenta lo que podra costar con la ayuda del seguro (puede ser ms barato con su seguro), pero el sitio web puede darle el precio si no utiliz ningn seguro.  - Puede imprimir el cupn correspondiente y llevarlo con su receta a la farmacia.  - Tambin puede pasar por nuestra oficina durante el horario de atencin regular y recoger una tarjeta de cupones de GoodRx.  - Si necesita que su receta se enve electrnicamente a una farmacia diferente, informe a nuestra oficina a travs de MyChart de [place] o por telfono llamando al [phone] y presione la opcin 4.  

## 2021-11-13 ENCOUNTER — Encounter: Payer: Self-pay | Admitting: Family Medicine

## 2021-11-13 ENCOUNTER — Telehealth (INDEPENDENT_AMBULATORY_CARE_PROVIDER_SITE_OTHER): Payer: 59 | Admitting: Family Medicine

## 2021-11-13 VITALS — Ht 72.0 in | Wt 191.0 lb

## 2021-11-13 DIAGNOSIS — I10 Essential (primary) hypertension: Secondary | ICD-10-CM | POA: Diagnosis not present

## 2021-11-13 NOTE — Assessment & Plan Note (Signed)
Elevated BP mildly SBP 202B, off Bystolic now on Amlodipine monotherapy '5mg'$  low dose - Home BP readings normal  No known complications   Plan:  1. Dose increase Amlodipine today from 5 to '10mg'$  daily, take 2 x of '5mg'$  tabs in AM for '10mg'$  daily dose, we can order '10mg'$  tab in future if indicated, or may trial split dosing '5mg'$  BID if preferred 2. Encourage improved lifestyle - low sodium diet, regular exercise 3. Continue monitor BP outside office, bring readings to next visit, if persistently >140/90 or new symptoms notify office sooner  Next option consider ARB add on therapy if need 2nd agent.

## 2021-11-13 NOTE — Telephone Encounter (Signed)
Virtual visit completed already  [name], [facility] 11/13/2021, 2:52 PM

## 2021-11-13 NOTE — Patient Instructions (Addendum)
   Please schedule a Follow-up Appointment to: Return if symptoms worsen or fail to improve.  If you have any other questions or concerns, please feel free to call the office or send a message through MyChart. You may also schedule an earlier appointment if necessary.  Additionally, you may be receiving a survey about your experience at our office within a few days to 1 week by e-mail or mail. We value your feedback.  Burlie Cajamarca, DO [facility], [facility] 

## 2021-11-13 NOTE — Progress Notes (Signed)
Subjective:    Patient ID: Lucas Lopez, male    DOB: 11/08/1960, 61 y.o.   MRN: [id number]  Darry Kelnhofer is a 61 y.o. male presenting on 11/13/2021 for Hypertension  Virtual / Telehealth Encounter - Video Visit via MyChart The purpose of this virtual visit is to provide medical care while limiting exposure to the novel coronavirus (COVID19) for both patient and office staff.  Consent was obtained for remote visit:  Yes.   Answered questions that patient had about telehealth interaction:  Yes.   I discussed the limitations, risks, security and privacy concerns of performing an evaluation and management service by video/telephone. I also discussed with the patient that there may be a patient responsible charge related to this service. The patient expressed understanding and agreed to proceed.  Patient Location: Home Provider Location: [facility] (Office)  Participants in virtual visit: - Patient: Gavan Nordby - CMA: Orinda Kenner, CMA - Provider: Dr Parks Ranger   HPI  CHRONIC HTN: Reports last visit 7/20 we phased him off Bystolic due to concerns of allergic response seems to be improving this problem. He was started on Amlodipine '5mg'$  daily at that visit. Current Meds - Amlodipine '5mg'$  daily in AM   Reports good compliance, took meds today. Tolerating well, w/o complaints. Denies CP, dyspnea, HA, edema, dizziness / lightheadedness  See his home BP readings below  In past he has taken Diuretic and prefers to avoid this as well in future. No significant swelling or side effect from Amlodipine He has taken Lisinopril in the past years ago with cough side effect  Home BP Readings  125/70 54 Low None Low 2021-10-28 114/69 56 Medium Medium Low Upper lip 2021-10-29 115/66 58 Medium None Low 2021-10-30 128/68 55 Medium None Low 2021-10-31 135/72 62 Medium Medium 139/78, 60 @ 20:44 2021-11-01 125/67 52 Low None Medium 134/66, 56 @ 17:06 2021-11-02  127/70 62 Medium None Low 2021-11-03 139/71 59 Low None Low 2021-11-04 126/72 59 Low None Low 2021-11-05 122/69 51 Low None Low BP taken at 17:16 instead of AM 2021-11-06 145/75 67 Low None Low Cat concern in morning. 128/69, 61 @ 17:26  2021-11-07 133/66 56 Low None Low First day on new BP medicine 121/72, 67 @ 13:20 2021-11-08 129/66 51 129/71 62 145/74 56 Low Low None Dropped H1 and H2 antagonists 2021-11-09 133/77 68 136/78 58 146/70 67 Medium Low Medium Took H1 and H2 in the evening 2021-11-10 126/72 63 137/73 64 143/75 68 Medium None Low 2021-11-11 152/70 60 134/69 56 Low None Low First BP taken later in morning than usual 2021-11-12 129/72 78      10/25/2021    9:16 AM 08/15/2021   10:42 AM 10/17/2020    1:27 PM  Depression screen PHQ 2/9  Decreased Interest 0 0 0  Down, Depressed, Hopeless 0 0 0  PHQ - 2 Score 0 0 0  Altered sleeping 1 1 0  Tired, decreased energy '1 1 1  '$ Change in appetite '1 1 2  '$ Feeling bad or failure about yourself  0 0 0  Trouble concentrating 0 0 0  Moving slowly or fidgety/restless 0 0 0  Suicidal thoughts 0 0 0  PHQ-9 Score '3 3 3  '$ Difficult doing work/chores Not difficult at all Not difficult at all Not difficult at all    Social History   Tobacco Use   Smoking status: Former    Packs/day: 1.00    Years: 13.00    Total pack years: 13.00  Types: Cigarettes   Smokeless tobacco: Former  Substance Use Topics   Alcohol use: Not Currently   Drug use: Not Currently    Types: Marijuana    Review of Systems Per HPI unless specifically indicated above     Objective:    Ht 6' (1.829 m)   Wt 191 lb (86.6 kg)   BMI 25.90 kg/m   Wt Readings from Last 3 Encounters:  11/13/21 191 lb (86.6 kg)  10/25/21 191 lb 3.2 oz (86.7 kg)  08/15/21 194 lb 9.6 oz (88.3 kg)    Physical Exam  Note examination was completely remotely via video observation objective data only  Gen - well-appearing, no acute distress or apparent pain, comfortable HEENT  - eyes appear clear without discharge or redness Heart/Lungs - cannot examine virtually - observed no evidence of coughing or labored breathing. Abd - cannot examine virtually  Skin - face visible today- no rash Neuro - awake, alert, oriented Psych - not anxious appearing   Results for orders placed or performed in visit on 10/18/21  CBC with Differential  Result Value Ref Range   WBC 3.5 (L) 3.8 - 10.8 Thousand/uL   RBC 4.48 4.20 - 5.80 Million/uL   Hemoglobin 14.0 13.2 - 17.1 g/dL   HCT 42.5 38.5 - 50.0 %   MCV 94.9 80.0 - 100.0 fL   MCH 31.3 27.0 - 33.0 pg   MCHC 32.9 32.0 - 36.0 g/dL   RDW 12.5 11.0 - 15.0 %   Platelets 142 140 - 400 Thousand/uL   MPV 12.0 7.5 - 12.5 fL   Neutro Abs 1,708 1,500 - 7,800 cells/uL   Lymphs Abs 1,299 850 - 3,900 cells/uL   Absolute Monocytes 392 200 - 950 cells/uL   Eosinophils Absolute 102 15 - 500 cells/uL   Basophils Absolute 0 0 - 200 cells/uL   Neutrophils Relative % 48.8 %   Total Lymphocyte 37.1 %   Monocytes Relative 11.2 %   Eosinophils Relative 2.9 %   Basophils Relative 0.0 %  Lipid panel  Result Value Ref Range   Cholesterol 195 <200 mg/dL   HDL 53 > OR = 40 mg/dL   Triglycerides 110 <150 mg/dL   LDL Cholesterol (Calc) 120 (H) mg/dL (calc)   Total CHOL/HDL Ratio 3.7 <5.0 (calc)   Non-HDL Cholesterol (Calc) 142 (H) <130 mg/dL (calc)  HgB A1c  Result Value Ref Range   Hgb A1c MFr Bld 5.5 <5.7 % of total Hgb   Mean Plasma Glucose 111 mg/dL   eAG (mmol/L) 6.2 mmol/L  PSA  Result Value Ref Range   PSA 1.84 < OR = 4.00 ng/mL  Comprehensive Metabolic Panel (CMET)  Result Value Ref Range   Glucose, Bld 101 (H) 65 - 99 mg/dL   BUN 15 7 - 25 mg/dL   Creat 0.96 0.70 - 1.35 mg/dL   BUN/Creatinine Ratio NOT APPLICABLE 6 - 22 (calc)   Sodium 138 135 - 146 mmol/L   Potassium 4.7 3.5 - 5.3 mmol/L   Chloride 103 98 - 110 mmol/L   CO2 28 20 - 32 mmol/L   Calcium 9.0 8.6 - 10.3 mg/dL   Total Protein 6.3 6.1 - 8.1 g/dL   Albumin 4.3  3.6 - 5.1 g/dL   Globulin 2.0 1.9 - 3.7 g/dL (calc)   AG Ratio 2.2 1.0 - 2.5 (calc)   Total Bilirubin 0.5 0.2 - 1.2 mg/dL   Alkaline phosphatase (APISO) 53 35 - 144 U/L   AST 14 10 - 35  U/L   ALT 14 9 - 46 U/L  TSH  Result Value Ref Range   TSH 1.93 0.40 - 4.50 mIU/L      Assessment & Plan:   Problem List Items Addressed This Visit     Essential hypertension - Primary    Elevated BP mildly SBP 102V, off Bystolic now on Amlodipine monotherapy '5mg'$  low dose - Home BP readings normal  No known complications   Plan:  1. Dose increase Amlodipine today from 5 to '10mg'$  daily, take 2 x of '5mg'$  tabs in AM for '10mg'$  daily dose, we can order '10mg'$  tab in future if indicated, or may trial split dosing '5mg'$  BID if preferred 2. Encourage improved lifestyle - low sodium diet, regular exercise 3. Continue monitor BP outside office, bring readings to next visit, if persistently >140/90 or new symptoms notify office sooner  Next option consider ARB add on therapy if need 2nd agent.      Relevant Medications   amLODipine (NORVASC) 5 MG tablet    No orders of the defined types were placed in this encounter.    Follow up plan: Return if symptoms worsen or fail to improve.  Re-schedule virtual for 4 weeks f/u BP readings and management.   Patient verbalizes understanding with the above medical recommendations including the limitation of remote medical advice.  Specific follow-up and call-back criteria were given for patient to follow-up or seek medical care more urgently if needed.  Total duration of direct patient care provided via video conference: 10 minutes   [name], [facility] 11/13/2021, 11:17 AM

## 2021-11-21 ENCOUNTER — Encounter: Payer: Self-pay | Admitting: Dermatology

## 2021-11-22 ENCOUNTER — Telehealth: Payer: 59 | Admitting: Family Medicine

## 2021-12-04 ENCOUNTER — Ambulatory Visit
Admission: RE | Admit: 2021-12-04 | Discharge: 2021-12-04 | Disposition: A | Discharge status: Home / Self Care | Payer: 59 | Attending: Family Medicine | Admitting: Family Medicine

## 2021-12-04 ENCOUNTER — Ambulatory Visit
Admission: RE | Admit: 2021-12-04 | Discharge: 2021-12-04 | Disposition: A | Discharge status: Home / Self Care | Payer: 59 | Source: Ambulatory Visit | Attending: Family Medicine | Admitting: Family Medicine

## 2021-12-04 ENCOUNTER — Other Ambulatory Visit: Payer: Self-pay | Admitting: Family Medicine

## 2021-12-04 ENCOUNTER — Encounter: Payer: Self-pay | Admitting: Family Medicine

## 2021-12-04 DIAGNOSIS — R053 Chronic cough: Secondary | ICD-10-CM

## 2021-12-04 DIAGNOSIS — Z87891 Personal history of nicotine dependence: Secondary | ICD-10-CM

## 2021-12-04 DIAGNOSIS — E7800 Pure hypercholesterolemia, unspecified: Secondary | ICD-10-CM

## 2021-12-04 NOTE — Telephone Encounter (Signed)
I believe this is your patient

## 2021-12-05 NOTE — Telephone Encounter (Signed)
Medication was discontinued 7/20/20203. Med not on med list. Requested Prescriptions  Pending Prescriptions Disp Refills  . rosuvastatin (CRESTOR) 5 MG tablet [Pharmacy Med Name: Rosuvastatin Calcium 5 MG Oral Tablet] 90 tablet 3    Sig: TAKE 1 TABLET BY MOUTH AT  BEDTIME     Cardiovascular:  Antilipid - Statins 2 Failed - 12/04/2021 11:19 PM      Failed - Lipid Panel in normal range within the last 12 months    Cholesterol  Date Value Ref Range Status  10/18/2021 195 <200 mg/dL Final   LDL Cholesterol (Calc)  Date Value Ref Range Status  10/18/2021 120 (H) mg/dL (calc) Final    Comment:    Reference range: <100 . Desirable range <100 mg/dL for primary prevention;   <70 mg/dL for patients with CHD or diabetic patients  with > or = 2 CHD risk factors. Marland Kitchen LDL-C is now calculated using the Martin-Hopkins  calculation, which is a validated novel method providing  better accuracy than the Friedewald equation in the  estimation of LDL-C.  Cresenciano Genre et al. Annamaria Helling. 0488;891(69): 2061-2068  (http://education.[employer].com/faq/FAQ164)    HDL  Date Value Ref Range Status  10/18/2021 53 > OR = 40 mg/dL Final   Triglycerides  Date Value Ref Range Status  10/18/2021 110 <150 mg/dL Final         Passed - Cr in normal range and within 360 days    Creat  Date Value Ref Range Status  10/18/2021 0.96 0.70 - 1.35 mg/dL Final         Passed - Patient is not pregnant      Passed - Valid encounter within last 12 months    Recent Outpatient Visits          3 weeks ago Essential hypertension   [place], DO   1 month ago Annual physical exam   [place], DO   3 months ago Urticarial rash   [place], DO   1 year ago Annual physical exam   [place], DO   1 year ago Acute left-sided low back pain without  sciatica   [facility] Parks Ranger, Devonne Doughty, DO      Future Appointments            In 6 days Parks Ranger, Devonne Doughty, [facility], [place]   In 4 months  [facility], [facility]

## 2021-12-11 ENCOUNTER — Telehealth (INDEPENDENT_AMBULATORY_CARE_PROVIDER_SITE_OTHER): Payer: Self-pay | Admitting: Family Medicine

## 2021-12-11 ENCOUNTER — Encounter: Payer: Self-pay | Admitting: Family Medicine

## 2021-12-11 ENCOUNTER — Encounter (INDEPENDENT_AMBULATORY_CARE_PROVIDER_SITE_OTHER): Payer: 59 | Admitting: Family Medicine

## 2021-12-11 VITALS — Ht 72.0 in | Wt 191.0 lb

## 2021-12-11 DIAGNOSIS — I10 Essential (primary) hypertension: Secondary | ICD-10-CM

## 2021-12-11 MED ORDER — VALSARTAN 40 MG PO TABS: 40.0000 mg | ORAL_TABLET | Freq: Every day | ORAL | 2 refills | Status: DC

## 2021-12-11 NOTE — Telephone Encounter (Signed)
Please see the MyChart message reply(ies) for my assessment and plan.    This patient gave consent for this Medical Advice Message and is aware that it may result in a bill to their insurance company, as well as the possibility of receiving a bill for a co-payment or deductible. They are an established patient, but are not seeking medical advice exclusively about a problem treated during an in person or video visit in the last seven days. I did not recommend an in person or video visit within seven days of my reply.    I spent a total of 10 minutes cumulative time within 7 days through MyChart messaging.  Brendt Dible, DO   

## 2021-12-11 NOTE — Progress Notes (Signed)
Patient was contacted approximately from 430pm to 445pm unable to reach him. He has corresponded on mychart message and is unable to proceed w/ the virtual visit due to time. I will correspond to him on mychart message instead  [name], [facility] 12/11/2021, 4:50 PM

## 2021-12-14 ENCOUNTER — Encounter: Payer: Self-pay | Admitting: Family Medicine

## 2021-12-14 DIAGNOSIS — I10 Essential (primary) hypertension: Secondary | ICD-10-CM

## 2021-12-14 DIAGNOSIS — E7800 Pure hypercholesterolemia, unspecified: Secondary | ICD-10-CM

## 2021-12-18 MED ORDER — NORLIQVA 1 MG/ML PO SOLN: 5.0000 mg | Freq: Every day | ORAL | 3 refills | Status: DC

## 2021-12-18 MED ORDER — VALSARTAN 4 MG/ML PO SOLN: 40.0000 mg | Freq: Every day | ORAL | 3 refills | Status: DC

## 2021-12-18 MED ORDER — SIMVASTATIN 40 MG/5ML PO SUSP: 16.0000 mg | Freq: Every day | ORAL | 3 refills | Status: DC

## 2022-01-01 ENCOUNTER — Ambulatory Visit: Payer: 59

## 2022-01-01 ENCOUNTER — Other Ambulatory Visit: Payer: Self-pay

## 2022-01-01 DIAGNOSIS — I10 Essential (primary) hypertension: Secondary | ICD-10-CM

## 2022-01-02 LAB — BASIC METABOLIC PANEL WITH GFR
BUN: 12 mg/dL (ref 7–25)
CO2: 27 mmol/L (ref 20–32)
Calcium: 9.6 mg/dL (ref 8.6–10.3)
Chloride: 102 mmol/L (ref 98–110)
Creat: 1.05 mg/dL (ref 0.70–1.35)
Glucose, Bld: 81 mg/dL (ref 65–139)
Potassium: 4.8 mmol/L (ref 3.5–5.3)
Sodium: 138 mmol/L (ref 135–146)
eGFR: 81 mL/min/{1.73_m2} (ref >=60)

## 2022-01-22 ENCOUNTER — Encounter: Payer: Self-pay | Admitting: Family Medicine

## 2022-01-22 ENCOUNTER — Telehealth (INDEPENDENT_AMBULATORY_CARE_PROVIDER_SITE_OTHER): Payer: 59 | Admitting: Family Medicine

## 2022-01-22 VITALS — Ht 72.0 in | Wt 191.0 lb

## 2022-01-22 DIAGNOSIS — I10 Essential (primary) hypertension: Secondary | ICD-10-CM

## 2022-01-22 MED ORDER — VALSARTAN 80 MG PO TABS: 80.0000 mg | ORAL_TABLET | Freq: Every day | ORAL | 1 refills | Status: DC

## 2022-01-22 MED ORDER — AMLODIPINE BESYLATE 5 MG PO TABS: 5.0000 mg | ORAL_TABLET | Freq: Every day | ORAL | 1 refills | Status: DC

## 2022-01-22 NOTE — Patient Instructions (Addendum)
   Please schedule a Follow-up Appointment to: Return in about 4 weeks (around 02/19/2022), or if symptoms worsen or fail to improve, for blood pressure.  If you have any other questions or concerns, please feel free to call the office or send a message through [facility]. You may also schedule an earlier appointment if necessary.  Additionally, you may be receiving a survey about your experience at our office within a few days to 1 week by e-mail or mail. We value your feedback.  [name], DO [facility]

## 2022-01-22 NOTE — Progress Notes (Signed)
 Subjective:    Patient ID: Lucas Lopez, male    DOB: 10/01/1960, 61 y.o.   MRN: [id number]  Aundra Phommachanh is a 61 y.o. male presenting on 01/22/2022 for Hypertension  Virtual / Telehealth Encounter - Video Visit via MyChart The purpose of this virtual visit is to provide medical care while limiting exposure to the novel coronavirus (COVID19) for both patient and office staff.  Consent was obtained for remote visit:  Yes.   Answered questions that patient had about telehealth interaction:  Yes.   I discussed the limitations, risks, security and privacy concerns of performing an evaluation and management service by video/telephone. I also discussed with the patient that there may be a patient responsible charge related to this service. The patient expressed understanding and agreed to proceed.  Patient Location: Home Provider Location: [facility] (Office)  Participants in virtual visit: - Patient: Lucas Lopez - CMA: Emily L Williams, CMA - Provider: Dr Karamalegos   HPI   CHRONIC HTN: Last updates since August, patient has been switched to liquid preparations to avoid allergen that was heightening his histamine response. He has done well but now at this point his histamine has been under control. BP trends not quite as controlled as previous back on Bystolic He would like to adjust doses for tighter more optimal BP control now  On Amlodipine liquid version = 5mg daily On Valsartan liquid 40mg daily  Reports good compliance, took meds today. Tolerating well, w/o complaints. Denies CP, dyspnea, HA, edema, dizziness / lightheadedness   See his home BP readings below   In past he has taken Diuretic and prefers to avoid this as well in future.  No significant swelling or side effect from Amlodipine He has taken Lisinopril in the past years ago with cough side effect  received some additional drop in BP with Tadalafil  Histamine seems to be under  control No longer taking anti histamine No more hives Also restarted Rosuvastatin pill.   Home BP Readings    09-29 S 2022-01-05 i 2022-01-06 c 2022-01-07 k 2022-01-08 2022-01-09 126/74 67 2022-01-10 2022-01-11 COVID vaccination 2022-01-12 None None None Restarted Tadalafil 2022-01-13 134/73 72 None None None 2022-01-14 133/71 82 153/67 76 127/76 68 None None None 2022-01-15 128/68 84 121/70 72 134/72 75 2022-01-16 127/69 73 113/70 91 125/73 77 131/68 71 2022-01-17 114/65 68 120/66 71 127/73 73 2022-01-18 120/72 60 137/66 87 126/70 73 2022-01-19 121/69 64 130/75 65 131/71 66 2022-01-20 123/72 62 134/81 66 2022-01-21 131/69 61 128/77 64 2022-01-22 132/73 66 124/68 70 2022-01-23 2022-01-24 2022-01-25 2022-01-26 2022-01-27     10/25/2021    9:16 AM 08/15/2021   10:42 AM 10/17/2020    1:27 PM  Depression screen PHQ 2/9  Decreased Interest 0 0 0  Down, Depressed, Hopeless 0 0 0  PHQ - 2 Score 0 0 0  Altered sleeping 1 1 0  Tired, decreased energy 1 1 1  Change in appetite 1 1 2  Feeling bad or failure about yourself  0 0 0  Trouble concentrating 0 0 0  Moving slowly or fidgety/restless 0 0 0  Suicidal thoughts 0 0 0  PHQ-9 Score 3 3 3  Difficult doing work/chores Not difficult at all Not difficult at all Not difficult at all    Social History   Tobacco Use   Smoking status: Former    Packs/day: 1.00    Years: 13.00    Total pack years: 13.00    Types: Cigarettes     Smokeless tobacco: Former  Substance Use Topics   Alcohol use: Not Currently   Drug use: Not Currently    Types: Marijuana    Review of Systems Per HPI unless specifically indicated above     Objective:    Ht 6' (1.829 m)   Wt 191 lb (86.6 kg)   BMI 25.90 kg/m   Wt Readings from Last 3 Encounters:  01/22/22 191 lb (86.6 kg)  12/11/21 191 lb (86.6 kg)  11/13/21 191 lb (86.6 kg)    Physical Exam  Note examination was completely remotely via video observation objective data  only  Gen - well-appearing, no acute distress or apparent pain, comfortable HEENT - eyes appear clear without discharge or redness Heart/Lungs - cannot examine virtually - observed no evidence of coughing or labored breathing. Abd - cannot examine virtually  Skin - face visible today- no rash Neuro - awake, alert, oriented Psych - not anxious appearing   Results for orders placed or performed in visit on 24/26/83  BASIC METABOLIC PANEL WITH GFR  Result Value Ref Range   Glucose, Bld 81 65 - 139 mg/dL   BUN 12 7 - 25 mg/dL   Creat 1.05 0.70 - 1.35 mg/dL   eGFR 81 > OR = 60 mL/min/1.81m   BUN/Creatinine Ratio SEE NOTE: 6 - 22 (calc)   Sodium 138 135 - 146 mmol/L   Potassium 4.8 3.5 - 5.3 mmol/L   Chloride 102 98 - 110 mmol/L   CO2 27 20 - 32 mmol/L   Calcium 9.6 8.6 - 10.3 mg/dL      Assessment & Plan:   Problem List Items Addressed This Visit     Essential hypertension - Primary   Relevant Medications   amLODipine (NORVASC) 5 MG tablet   valsartan (DIOVAN) 80 MG tablet    Improved control Now reassurance with reduced histamine response Back to tablet form medications Dose increase Valsartan from 40 to 8838mContinue Amlodipine 38m32maily Future BP log review and he can message we can adjust from Valsartan 80 to 160 or try the combo therapy Amlodipine 5 / Valsartan 160m70m needed going forward   Meds ordered this encounter  Medications   amLODipine (NORVASC) 5 MG tablet    Sig: Take 1 tablet (5 mg total) by mouth daily.    Dispense:  90 tablet    Refill:  1   valsartan (DIOVAN) 80 MG tablet    Sig: Take 1 tablet (80 mg total) by mouth daily.    Dispense:  90 tablet    Refill:  1     Follow up plan: Return in about 4 weeks (around 02/19/2022), or if symptoms worsen or fail to improve, for blood pressure.   Patient verbalizes understanding with the above medical recommendations including the limitation of remote medical advice.  Specific follow-up and  call-back criteria were given for patient to follow-up or seek medical care more urgently if needed.  Total duration of direct patient care provided via video conference: 20 minutes   AlexNobie Putnam STexhomaup 01/22/2022, 11:22 AM

## 2022-03-05 ENCOUNTER — Encounter: Payer: Self-pay | Admitting: Family Medicine

## 2022-03-05 DIAGNOSIS — I10 Essential (primary) hypertension: Secondary | ICD-10-CM

## 2022-03-08 MED ORDER — VALSARTAN 80 MG PO TABS: 240.0000 mg | ORAL_TABLET | Freq: Every day | ORAL | 0 refills | Status: DC

## 2022-03-08 MED ORDER — AMLODIPINE BESYLATE 5 MG PO TABS: 5.0000 mg | ORAL_TABLET | Freq: Every day | ORAL | 0 refills | Status: DC

## 2022-03-08 NOTE — Addendum Note (Signed)
Addended by: Jearld Fenton on: 03/08/2022 12:43 PM   Modules accepted: Orders

## 2022-03-08 NOTE — Addendum Note (Signed)
Addended by: Jearld Fenton on: 03/08/2022 01:09 PM   Modules accepted: Orders

## 2022-03-20 ENCOUNTER — Other Ambulatory Visit: Payer: Self-pay | Admitting: Internal Medicine

## 2022-03-20 DIAGNOSIS — I10 Essential (primary) hypertension: Secondary | ICD-10-CM

## 2022-03-20 NOTE — Telephone Encounter (Signed)
Rx was sent to [facility] 03/08/22 #270/0  Requested Prescriptions  Pending Prescriptions Disp Refills   valsartan (DIOVAN) 80 MG tablet [Pharmacy Med Name: Valsartan 80 MG Oral Tablet] 270 tablet 3    Sig: TAKE 3 TABLETS BY MOUTH DAILY     Cardiovascular:  Angiotensin Receptor Blockers Passed - 03/20/2022  8:42 AM      Passed - Cr in normal range and within 180 days    Creat  Date Value Ref Range Status  01/01/2022 1.05 0.70 - 1.35 mg/dL Final         Passed - K in normal range and within 180 days    Potassium  Date Value Ref Range Status  01/01/2022 4.8 3.5 - 5.3 mmol/L Final         Passed - Patient is not pregnant      Passed - Last BP in normal range    BP Readings from Last 1 Encounters:  10/25/21 138/75         Passed - Valid encounter within last 6 months    Recent Outpatient Visits           1 month ago Essential hypertension   [facility] [place], DO   3 months ago Essential hypertension   [place], DO   4 months ago Essential hypertension   [place], DO   4 months ago Annual physical exam   [place], DO   7 months ago Urticarial rash   [facility] [place], Devonne Doughty, DO       Future Appointments             In 1 month [facility], [facility]

## 2022-03-28 ENCOUNTER — Encounter: Payer: Self-pay | Admitting: Family Medicine

## 2022-03-28 DIAGNOSIS — I10 Essential (primary) hypertension: Secondary | ICD-10-CM

## 2022-04-03 MED ORDER — AMLODIPINE BESYLATE 5 MG PO TABS: 5.0000 mg | ORAL_TABLET | Freq: Every day | ORAL | 3 refills | Status: DC

## 2022-04-03 MED ORDER — VALSARTAN 80 MG PO TABS: 240.0000 mg | ORAL_TABLET | Freq: Every day | ORAL | 3 refills | Status: DC

## 2022-04-11 ENCOUNTER — Encounter: Payer: Self-pay | Admitting: Internal Medicine

## 2022-04-11 ENCOUNTER — Ambulatory Visit (INDEPENDENT_AMBULATORY_CARE_PROVIDER_SITE_OTHER): Payer: 59 | Admitting: Internal Medicine

## 2022-04-11 VITALS — BP 138/70 | HR 94 | Temp 97.3°F | Wt 186.0 lb

## 2022-04-11 DIAGNOSIS — U071 COVID-19: Secondary | ICD-10-CM | POA: Diagnosis not present

## 2022-04-11 DIAGNOSIS — Z20818 Contact with and (suspected) exposure to other bacterial communicable diseases: Secondary | ICD-10-CM | POA: Diagnosis not present

## 2022-04-11 LAB — POCT RAPID STREP A (OFFICE): Rapid Strep A Screen: NEGATIVE

## 2022-04-11 LAB — POC COVID19 BINAXNOW: SARS Coronavirus 2 Ag: POSITIVE — AB

## 2022-04-11 NOTE — Patient Instructions (Signed)

## 2022-04-11 NOTE — Progress Notes (Signed)
Subjective:    Patient ID: Lucas Lopez, male    DOB: 1960/05/01, 62 y.o.   MRN: [id number]  HPI  Patient presents to clinic today with complaint of fever, headache, runny nose, sore throat and cough.  This started last night. He reports the runny nose is not unusual for him. He denies difficulty swallowing. The cough is nonproductive. He denies nasal congestion, ear pain, shortness of breath, nausea or vomiting. He denies chills or body aches. He has tried Tylenol OTC with some relief of symptoms. He has had sick contacts diagnosed with covid and flu.  Review of Systems     Past Medical History:  Diagnosis Date   Diverticulitis    Eczema    Gout    In the left most 3 toes of left foot   Vitiligo     Current Outpatient Medications  Medication Sig Dispense Refill   amLODipine (NORVASC) 5 MG tablet Take 1 tablet (5 mg total) by mouth daily. 90 tablet 3   cetirizine (ZYRTEC) 10 MG tablet Take 1 tablet (10 mg total) by mouth 2 (two) times daily. 180 tablet 3   clotrimazole-betamethasone (LOTRISONE) cream Apply 1 application topically 2 (two) times daily.     cromolyn (NASALCROM) 5.2 MG/ACT nasal spray Place 1 spray into both nostrils daily as needed.     EPINEPHrine (EPIPEN 2-PAK) 0.3 mg/0.3 mL IJ SOAJ injection Inject 0.3 mg into the muscle as needed for anaphylaxis. 1 each 1   omeprazole (PRILOSEC) 20 MG capsule Take 1 capsule (20 mg total) by mouth daily as needed. 30 capsule 1   Polypodium Leucotomos (HELIOCARE PO) Take by mouth.     rosuvastatin (CRESTOR) 5 MG tablet Take 5 mg by mouth daily.     tadalafil (CIALIS) 5 MG tablet Take 5 mg by mouth daily as needed.     valsartan (DIOVAN) 80 MG tablet Take 3 tablets (240 mg total) by mouth daily. 270 tablet 3   No current facility-administered medications for this visit.    Allergies  Allergen Reactions   Nsaids Hives, Itching and Swelling   Rosuvastatin Hives, Itching and Swelling   Tadalafil Hives, Itching and Swelling    Alpha-Gal Diarrhea, Hives, Itching, Rash and Swelling    Family History  Problem Relation Age of Onset   Hypertension Mother    Heart attack Mother 80   Fibroids Mother    Pneumonia Father 29   Hyperlipidemia Father    Hypertension Father    Osteoarthritis Father    Psoriasis Sister    Skin cancer Sister     Social History   Socioeconomic History   Marital status: Married    Spouse name: Caeleb Batalla   Number of children: Not on file   Years of education: Not on file   Highest education level: Not on file  Occupational History   Occupation: Scientist, forensic    Comment: (retiring in Summer 2021)  Tobacco Use   Smoking status: Former    Packs/day: 1.00    Years: 13.00    Total pack years: 13.00    Types: Cigarettes   Smokeless tobacco: Former  Substance and Sexual Activity   Alcohol use: Not Currently   Drug use: Not Currently    Types: Marijuana   Sexual activity: Not on file  Other Topics Concern   Not on file  Social History Narrative   Not on file   Social Determinants of Health   Financial Resource Strain: Not on file  Food Insecurity:  Not on file  Transportation Needs: Not on file  Physical Activity: Not on file  Stress: Not on file  Social Connections: Not on file  Intimate Partner Violence: Not on file     Constitutional: Patient reports fever, headache.  Denies malaise, fatigue, or abrupt weight changes.  HEENT: Patient reports runny nose and sore throat.  Denies eye pain, eye redness, ear pain, ringing in the ears, wax buildup, nasal congestion, bloody nose. Respiratory: Pt reports cough. Denies difficulty breathing, shortness of breath, or sputum production.   Cardiovascular: Denies chest pain, chest tightness, palpitations or swelling in the hands or feet.  Gastrointestinal: Denies abdominal pain, bloating, constipation, or blood in the stool.   No other specific complaints in a complete review of systems (except as listed in HPI  above).  Objective:   Physical Exam  BP 138/70 (BP Location: Right Arm, Patient Position: Sitting, Cuff Size: Normal)   Pulse 94   Temp (!) 97.3 F (36.3 C) (Temporal)   Wt 186 lb (84.4 kg)   SpO2 99%   BMI 25.23 kg/m    Wt Readings from Last 3 Encounters:  01/22/22 191 lb (86.6 kg)  12/11/21 191 lb (86.6 kg)  11/13/21 191 lb (86.6 kg)    General: Appears his stated age, well developed, well nourished in NAD. Skin: Warm, dry and intact. No rashes noted. HEENT: Head: normal shape and size, no sinus tenderness noted; Eyes: sclera white, no icterus, conjunctiva pink, PERRLA and EOMs intact;  Nose: mucosa pink and moist, septum midline; Throat/Mouth: Teeth present, mucosa pink and moist, no exudate, lesions or ulcerations noted.  Neck:  No adenopathy noted. Cardiovascular: Normal rate and rhythm. S1,S2 noted.  No murmur, rubs or gallops noted.  Pulmonary/Chest: Normal effort and positive vesicular breath sounds. No respiratory distress. No wheezes, rales or ronchi noted.  Neurological: Alert and oriented.   BMET    Component Value Date/Time   NA 138 01/01/2022 1349   K 4.8 01/01/2022 1349   CL 102 01/01/2022 1349   CO2 27 01/01/2022 1349   GLUCOSE 81 01/01/2022 1349   BUN 12 01/01/2022 1349   CREATININE 1.05 01/01/2022 1349   CALCIUM 9.6 01/01/2022 1349   GFRNONAA 83 10/29/2019 0848   GFRAA 96 10/29/2019 0848    Lipid Panel     Component Value Date/Time   CHOL 195 10/18/2021 0819   TRIG 110 10/18/2021 0819   HDL 53 10/18/2021 0819   CHOLHDL 3.7 10/18/2021 0819   LDLCALC 120 (H) 10/18/2021 0819    CBC    Component Value Date/Time   WBC 3.5 (L) 10/18/2021 0819   RBC 4.48 10/18/2021 0819   HGB 14.0 10/18/2021 0819   HCT 42.5 10/18/2021 0819   PLT 142 10/18/2021 0819   MCV 94.9 10/18/2021 0819   MCH 31.3 10/18/2021 0819   MCHC 32.9 10/18/2021 0819   RDW 12.5 10/18/2021 0819   LYMPHSABS 1,299 10/18/2021 0819   EOSABS 102 10/18/2021 0819   BASOSABS 0  10/18/2021 0819    Hgb A1C Lab Results  Component Value Date   HGBA1C 5.5 10/18/2021           Assessment & Plan:   Covid 19:  Encouraged rest and fluids Discussed quarantine for 5 days Discussed Paxlovid but given his mild symptoms, will hold off on this for now [place] to continue Tylneol OTC as needed for fever Can take cough drop or cough syrup as needed for cough  Follow-up with your PCP as previously scheduled  Webb Silversmith, NP

## 2022-04-25 ENCOUNTER — Other Ambulatory Visit: Payer: Self-pay

## 2022-04-25 ENCOUNTER — Encounter: Payer: Self-pay | Admitting: Family Medicine

## 2022-04-25 DIAGNOSIS — N529 Male erectile dysfunction, unspecified: Secondary | ICD-10-CM

## 2022-04-25 MED ORDER — TADALAFIL 5 MG PO TABS: 5.0000 mg | ORAL_TABLET | Freq: Every day | ORAL | 3 refills | Status: DC

## 2022-04-29 ENCOUNTER — Other Ambulatory Visit: Payer: Self-pay

## 2022-04-29 DIAGNOSIS — R972 Elevated prostate specific antigen [PSA]: Secondary | ICD-10-CM

## 2022-04-29 DIAGNOSIS — N529 Male erectile dysfunction, unspecified: Secondary | ICD-10-CM

## 2022-04-30 ENCOUNTER — Other Ambulatory Visit: Payer: 59

## 2022-05-01 LAB — PSA: PSA: 0.77 ng/mL (ref <=4.00)

## 2022-06-30 ENCOUNTER — Other Ambulatory Visit: Payer: Self-pay | Admitting: Family Medicine

## 2022-07-02 NOTE — Telephone Encounter (Signed)
Requested medication (s) are due for refill today: Amount not specified  Requested medication (s) are on the active medication list: yes    Last refill: 01/22/22  Future visit scheduled no  Notes to clinic:Historical provider, please review. Thank you  Requested Prescriptions  Pending Prescriptions Disp Refills   rosuvastatin (CRESTOR) 5 MG tablet [Pharmacy Med Name: Rosuvastatin Calcium 5 MG Oral Tablet] 90 tablet 3    Sig: TAKE 1 TABLET BY MOUTH AT  BEDTIME     Cardiovascular:  Antilipid - Statins 2 Failed - 06/30/2022 10:09 PM      Failed - Lipid Panel in normal range within the last 12 months    Cholesterol  Date Value Ref Range Status  10/18/2021 195 <200 mg/dL Final   LDL Cholesterol (Calc)  Date Value Ref Range Status  10/18/2021 120 (H) mg/dL (calc) Final    Comment:    Reference range: <100 . Desirable range <100 mg/dL for primary prevention;   <70 mg/dL for patients with CHD or diabetic patients  with > or = 2 CHD risk factors. Marland Kitchen LDL-C is now calculated using the Martin-Hopkins  calculation, which is a validated novel method providing  better accuracy than the Friedewald equation in the  estimation of LDL-C.  Cresenciano Genre et al. Annamaria Helling. [id number]): 2061-2068  (http://education.[employer].com/faq/FAQ164)    HDL  Date Value Ref Range Status  10/18/2021 53 > OR = 40 mg/dL Final   Triglycerides  Date Value Ref Range Status  10/18/2021 110 <150 mg/dL Final         Passed - Cr in normal range and within 360 days    Creat  Date Value Ref Range Status  01/01/2022 1.05 0.70 - 1.35 mg/dL Final         Passed - Patient is not pregnant      Passed - Valid encounter within last 12 months    Recent Outpatient Visits           2 months ago [facility] [place], Coralie Keens, NP   5 months ago Essential hypertension   [place], DO   6 months ago Essential hypertension    [place], DO   7 months ago Essential hypertension   [place], DO   8 months ago Annual physical exam   [facility] [place], Devonne Doughty, [place]

## 2022-08-22 IMAGING — US US SOFT TISSUE HEAD/NECK
1 series · 14 of 22 positions shown · non-contrast
Comparison: None.

CLINICAL DATA: Left neck lymph node swelling and pain for 4 weeks,
dysphagia, decreased range of motion

EXAM:
ULTRASOUND OF HEAD/NECK SOFT TISSUES
TECHNIQUE: Ultrasound examination of the head and neck soft tissues was
performed in the area of clinical concern.

[Series 1: us soft tissue head/neck · 0.07mm/px · 14 of 22 slices shown]
[im 1/22]
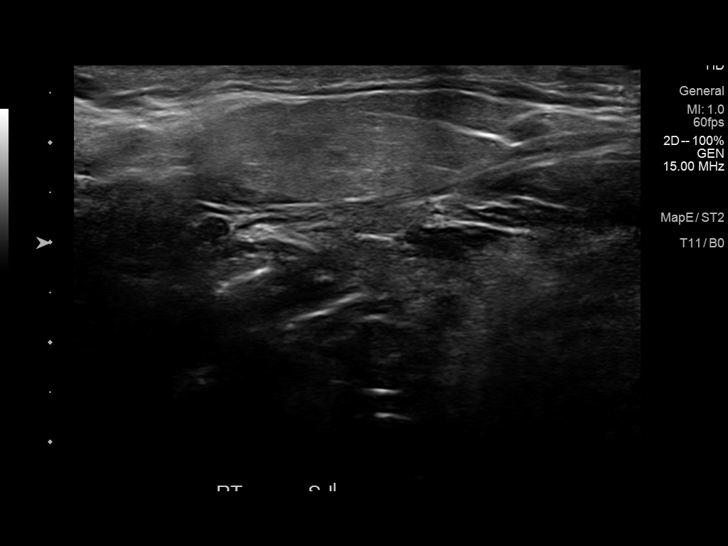
[im 3/22]
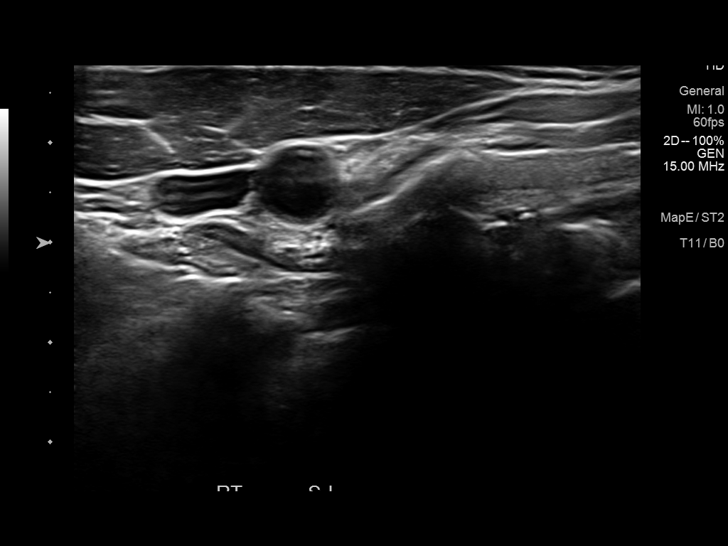
[im 4/22]
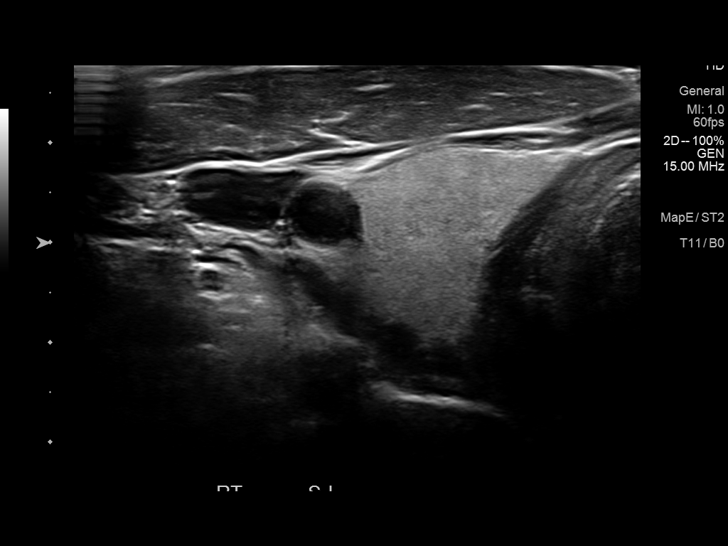
[im 6/22]
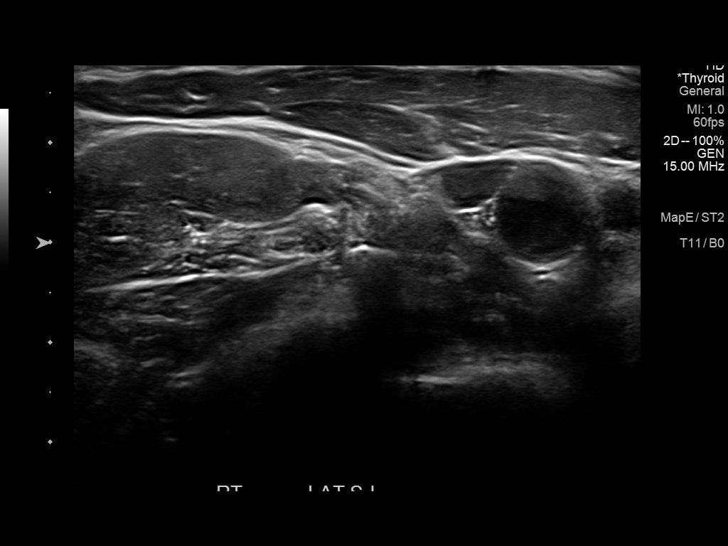
[im 8/22]
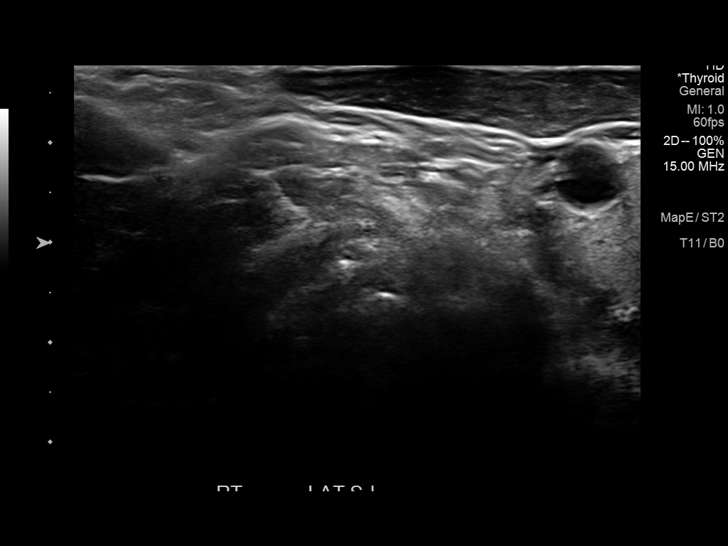
[im 9/22]
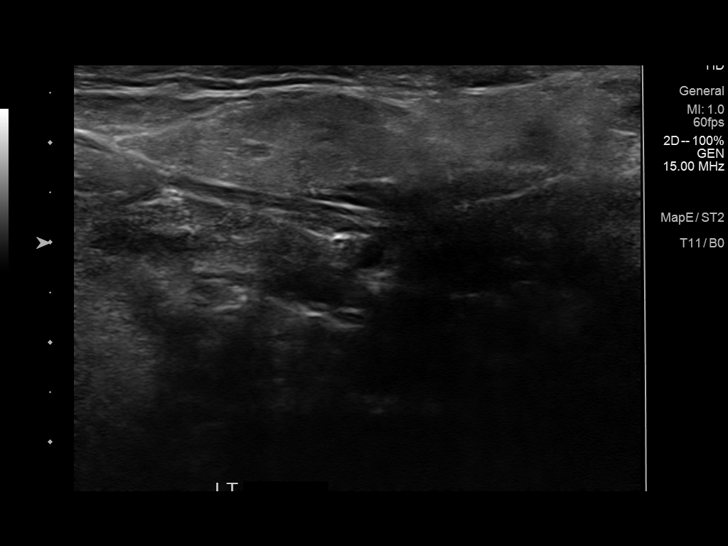
[im 11/22]
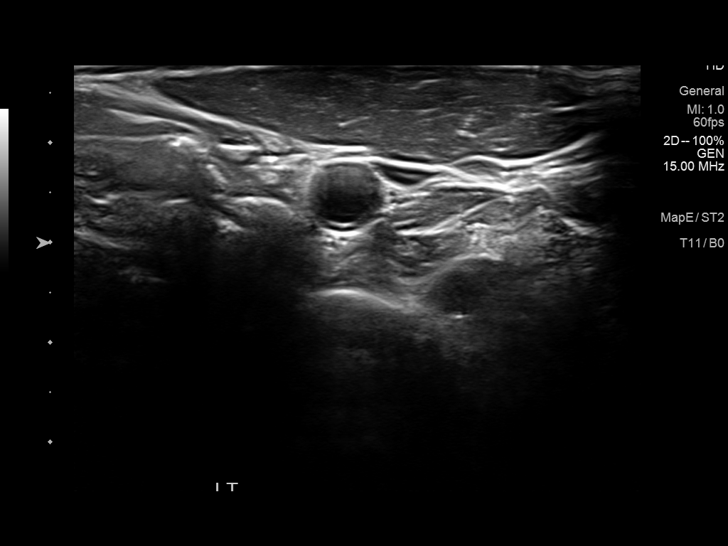
[im 12/22]
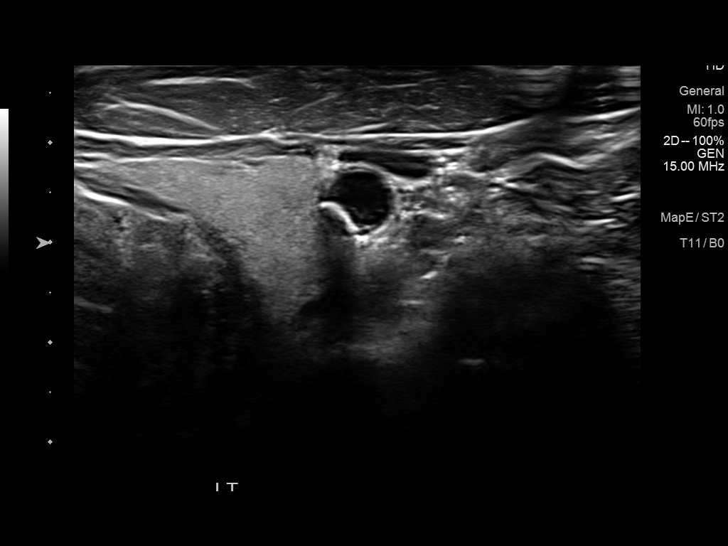
[im 14/22]
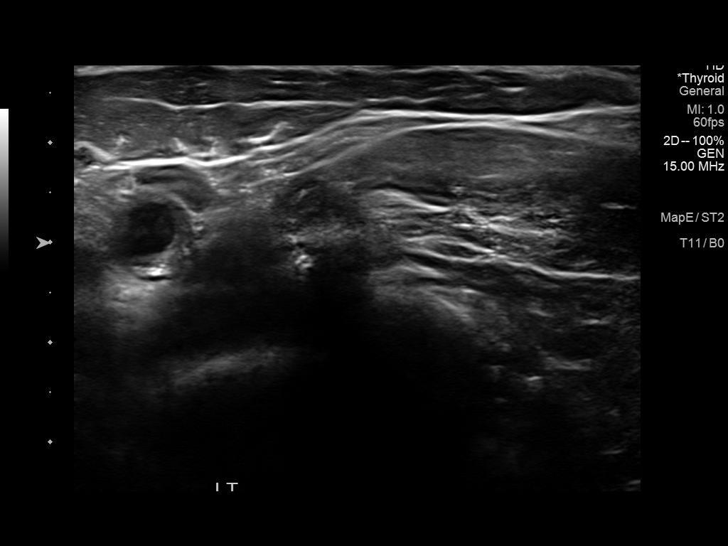
[im 15/22]
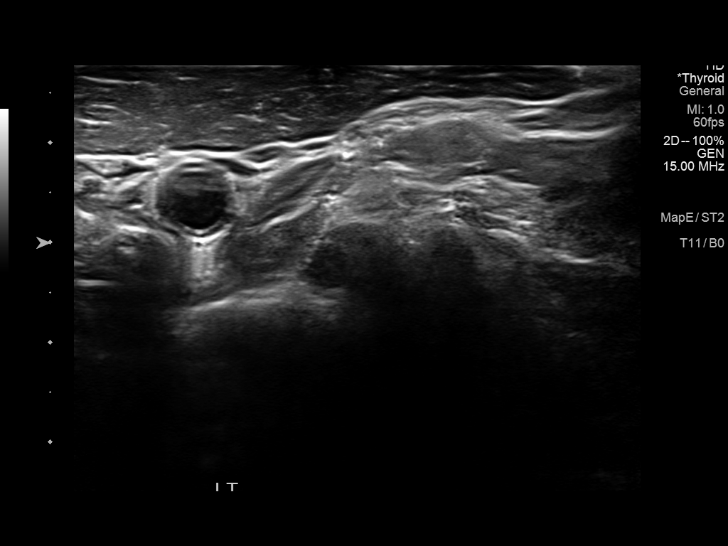
[im 17/22]
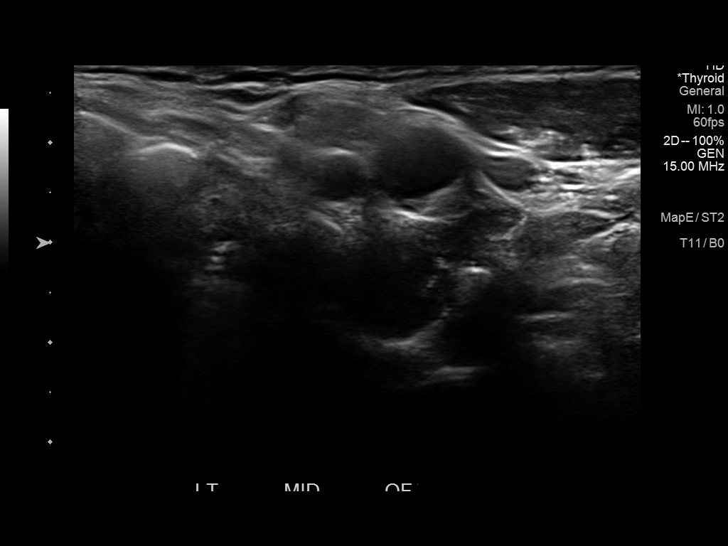
[im 19/22]
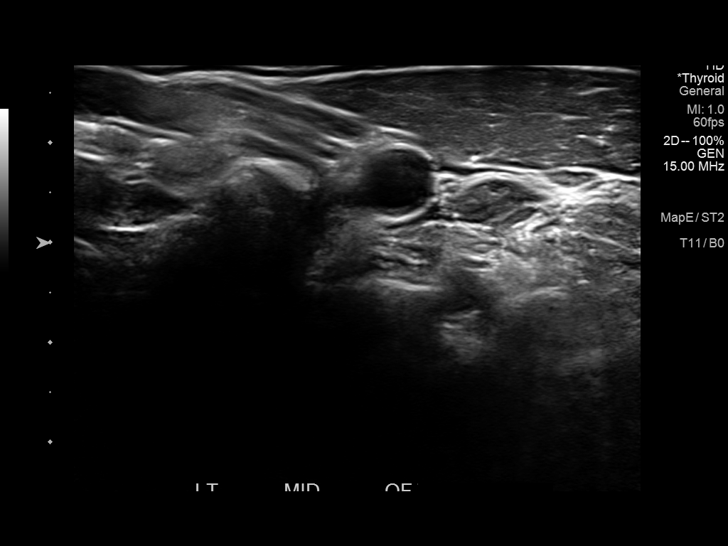
[im 20/22]
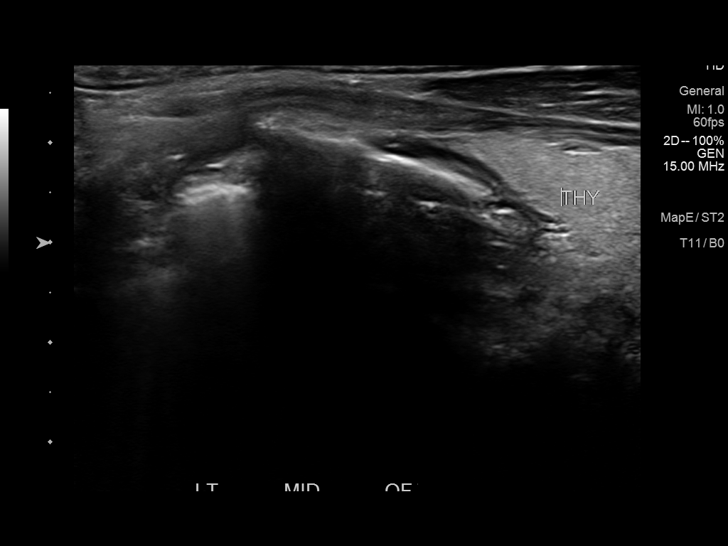
[im 22/22]
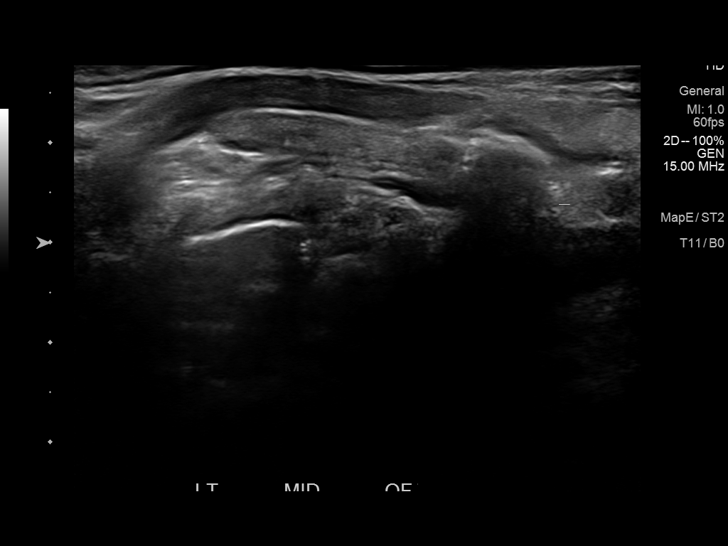

[14 of 22 positions shown; findings below may reference images not displayed]

FINDINGS: Ultrasound performed of the left neck area of concern. Comparison
also performed with imaging of the right neck.

No underlying left mid neck soft tissue mass, cyst, fluid
collection, or bulky adenopathy. Comparison right neck imaging also
unremarkable.
IMPRESSION: No significant soft tissue neck abnormality demonstrated by
ultrasound.

## 2022-11-21 ENCOUNTER — Encounter: Payer: 59 | Admitting: Dermatology

## 2022-11-26 ENCOUNTER — Encounter: Payer: 59 | Admitting: Dermatology

## 2023-01-29 ENCOUNTER — Other Ambulatory Visit: Payer: Self-pay | Admitting: Family Medicine

## 2023-01-29 ENCOUNTER — Encounter: Payer: Self-pay | Admitting: Family Medicine

## 2023-01-29 ENCOUNTER — Ambulatory Visit: Payer: 59 | Admitting: Family Medicine

## 2023-01-29 VITALS — BP 100/60 | Ht 72.0 in | Wt 182.0 lb

## 2023-01-29 DIAGNOSIS — I10 Essential (primary) hypertension: Secondary | ICD-10-CM

## 2023-01-29 DIAGNOSIS — K5792 Diverticulitis of intestine, part unspecified, without perforation or abscess without bleeding: Secondary | ICD-10-CM

## 2023-01-29 DIAGNOSIS — Q402 Other specified congenital malformations of stomach: Secondary | ICD-10-CM | POA: Diagnosis not present

## 2023-01-29 DIAGNOSIS — R7309 Other abnormal glucose: Secondary | ICD-10-CM

## 2023-01-29 DIAGNOSIS — R972 Elevated prostate specific antigen [PSA]: Secondary | ICD-10-CM

## 2023-01-29 DIAGNOSIS — Z0000 Encounter for general adult medical examination without abnormal findings: Secondary | ICD-10-CM

## 2023-01-29 DIAGNOSIS — E7800 Pure hypercholesterolemia, unspecified: Secondary | ICD-10-CM

## 2023-01-29 DIAGNOSIS — Z1211 Encounter for screening for malignant neoplasm of colon: Secondary | ICD-10-CM

## 2023-01-29 MED ORDER — AMOXICILLIN-POT CLAVULANATE 875-125 MG PO TABS: 1.0000 | ORAL_TABLET | Freq: Two times a day (BID) | ORAL | 0 refills | Status: DC

## 2023-01-29 NOTE — Progress Notes (Signed)
Subjective:    Patient ID: Lucas Lopez, male    DOB: [birth year], 61 y.o.   MRN: [id number]  Lucas Lopez is a 62 y.o. male presenting on 01/29/2023 for Diverticulitis   HPI  Discussed the use of AI scribe software for clinical note transcription with the patient, who gave verbal consent to proceed.     The patient, with a history of diverticulitis, presents with a recurrence of lower abdominal pain, which he describes as low-grade and more noticeable when in a 'scrunched over' position. The pain is located in the right lower quadrant and is exacerbated by deep palpation. The patient denies any changes in bowel habits, fever, or other systemic symptoms.  The patient's history of diverticulitis began approximately a decade ago, which led to his first colonoscopy and subsequent polyp removal 2018. Since then, he has not experienced any significant issues until the recent onset of this familiar pain. The patient also recalls having an upper endoscopy at [facility] in 2021, which revealed ectopic gastric mucosa, but no lower gastrointestinal investigation was performed at that time.  The patient's last colonoscopy was in 2018, and he believes it may be time for another, given the current symptoms. He expresses a preference for returning to [facility] for this procedure. The patient also has a history of alpha-gal syndrome, which he manages on an ongoing basis.          10/25/2021    9:16 AM 08/15/2021   10:42 AM 10/17/2020    1:27 PM  Depression screen PHQ 2/9  Decreased Interest 0 0 0  Down, Depressed, Hopeless 0 0 0  PHQ - 2 Score 0 0 0  Altered sleeping 1 1 0  Tired, decreased energy 1 1 1   Change in appetite 1 1 2   Feeling bad or failure about yourself  0 0 0  Trouble concentrating 0 0 0  Moving slowly or fidgety/restless 0 0 0  Suicidal thoughts 0 0 0  PHQ-9 Score 3 3 3   Difficult doing work/chores Not difficult at all Not difficult at all Not difficult at all    Social  History   Tobacco Use   Smoking status: Former    Current packs/day: 1.00    Average packs/day: 1 pack/day for 13.0 years (13.0 ttl pk-yrs)    Types: Cigarettes   Smokeless tobacco: Former  Substance Use Topics   Alcohol use: Not Currently   Drug use: Not Currently    Types: Marijuana    Review of Systems Per HPI unless specifically indicated above     Objective:    BP 100/60   Ht 6' (1.829 m)   Wt 182 lb (82.6 kg)   BMI 24.68 kg/m   Wt Readings from Last 3 Encounters:  01/29/23 182 lb (82.6 kg)  04/11/22 186 lb (84.4 kg)  01/22/22 191 lb (86.6 kg)    Physical Exam Vitals and nursing note reviewed.  Constitutional:      General: He is not in acute distress.    Appearance: Normal appearance. He is well-developed. He is not diaphoretic.     Comments: Well-appearing, comfortable, cooperative  HENT:     Head: Normocephalic and atraumatic.  Eyes:     General:        Right eye: No discharge.        Left eye: No discharge.     Conjunctiva/sclera: Conjunctivae normal.  Cardiovascular:     Rate and Rhythm: Normal rate.  Pulmonary:     Effort: Pulmonary  effort is normal.  Abdominal:     General: There is no distension.     Palpations: There is no mass.     Tenderness: There is abdominal tenderness (mid to R lower quad). There is no guarding or rebound.     Hernia: No hernia is present.     Comments: Active bowel sounds  Skin:    General: Skin is warm and dry.     Findings: No erythema or rash.  Neurological:     Mental Status: He is alert and oriented to person, place, and time.  Psychiatric:        Mood and Affect: Mood normal.        Behavior: Behavior normal.        Thought Content: Thought content normal.     Comments: Well groomed, good eye contact, normal speech and thoughts    Results for orders placed or performed in visit on 04/29/22  PSA  Result Value Ref Range   PSA 0.77 < OR = 4.00 ng/mL      Assessment & Plan:   Problem List Items Addressed  This Visit   None Visit Diagnoses     Diverticulitis    -  Primary   Relevant Medications   amoxicillin-clavulanate (AUGMENTIN) 875-125 MG tablet   Ectopic gastric mucosa           Assessment and Plan    Diverticulitis Low-grade right lower quadrant pain, possibly related to a flare-up of diverticulitis. No fever, bleeding, or significant changes in bowel habits. -Start Augmentin twice daily for 10 days to cover possible infection. -Refer to [facility] for colonoscopy given history of polyps and current symptoms.  Gastroesophageal Reflux Disease (GERD) History of heartburn and upper endoscopy in 2021 revealing ectopic gastric mucosa. No current symptoms or concerns. -Continue current management.  Alpha-gal Syndrome Chronic loose stools, likely related to Alpha-gal Syndrome. No change in symptoms. -Continue current management.      Orders Placed This Encounter  Procedures   Ambulatory referral to Gastroenterology    Referral Priority:   Routine    Referral Type:   Consultation    Referral Reason:   Specialty Services Required    Number of Visits Requested:   1     Meds ordered this encounter  Medications   amoxicillin-clavulanate (AUGMENTIN) 875-125 MG tablet    Sig: Take 1 tablet by mouth 2 (two) times daily.    Dispense:  20 tablet    Refill:  0      Follow up plan: Return if symptoms worsen or fail to improve.  Future labs 04/2023  Saralyn Pilar, DO [facility] 01/29/2023, 3:08 PM

## 2023-01-29 NOTE — Patient Instructions (Addendum)
Thank you for coming to the office today.  Try the Augmentin antibiotic course for diverticulitis  Also referral to [facility] for Colonoscopy.  Diverticulitis  Diverticulitis happens when poop (stool) and bacteria get trapped in small pouches in the colon called diverticula. These pouches may form if you have a condition called diverticulosis. When the poop and bacteria get trapped, it can cause an infection and inflammation. Diverticulitis may cause severe stomach pain and diarrhea. It can also lead to tissue damage in your colon. This can cause bleeding or blockage. In some cases, the diverticula may burst (rupture). This can cause infected poop to go into other parts of your abdomen. What are the causes? This condition is caused by poop getting trapped in the diverticula. This allows bacteria to grow. It can lead to inflammation and infection. What increases the risk? You are more likely to get this condition if you have diverticulosis. You are also more at risk if: You are overweight or obese. You do not get enough exercise. You drink alcohol. You smoke. You eat a lot of red meat, such as beef, pork, or lamb. You do not get enough fiber. Foods high in fiber include fruits, vegetables, beans, nuts, and whole grains. You are over 49 years of age. What are the signs or symptoms? Symptoms of this condition may include: Pain and tenderness in the abdomen. This pain is often felt on the left side but may occur in other spots. Fever and chills. Nausea and vomiting. Cramping. Bloating. Changes in how often you poop. Blood in your poop. How is this diagnosed? This condition is diagnosed based on your medical history and a physical exam. You may also have tests done to make sure there is nothing else causing your condition. These tests may include: Blood tests. Tests done on your pee (urine). A CT scan of the abdomen. You may need to have a colonoscopy. This is an exam to look at your whole  large intestine. During the exam, a tube is put into the opening of your butt (anus) and then moved into your rectum, colon, and other parts of the large intestine. This exam is done to look at the diverticula. It can also see if there is something else that may be causing your symptoms. How is this treated? Most cases are mild and can be treated at home. You may be told to: Take over-the-counter pain medicine. Only eat and drink clear liquids. Take antibiotics. Rest. More severe cases may need to be treated at a hospital. Treatment may include: Not eating or drinking. Taking pain medicines. Getting antibiotics through an IV. Getting fluids and nutrition through an IV. Surgery. Follow these instructions at home: Medicines Take over-the-counter and prescription medicines only as told by your health care provider. These include fiber supplements, probiotics, and medicines to soften your poop (stool softeners). If you were prescribed antibiotics, take them as told by your provider. Do not stop using the antibiotic even if you start to feel better. Ask your provider if the medicine prescribed to you requires you to avoid driving or using machinery. Eating and drinking  Follow the diet told by your provider. You may need to only eat and drink liquids. After your symptoms get better, you may be able to return to a more normal diet. You may be told to eat at least 25 grams (25 g) of fiber each day. Fiber makes it easier to poop. Healthy sources of fiber include: Berries. One cup has 4-8 g  of fiber. Beans or lentils. One-half cup has 5-8 g of fiber. Green vegetables. One cup has 4 g of fiber. Avoid eating red meat. General instructions Do not use any products that contain nicotine or tobacco. These products include cigarettes, chewing tobacco, and vaping devices, such as e-cigarettes. If you need help quitting, ask your provider. Exercise for at least 30 minutes, 3 times a week. Exercise hard  enough to raise your heart rate and break a sweat. Contact a health care provider if: Your pain gets worse. Your pooping does not go back to normal. Your symptoms do not get better with treatment. Your symptoms get worse all of a sudden. You have a fever. You vomit more than one time. Your poop is bloody, black, or tarry. This information is not intended to replace advice given to you by your health care provider. Make sure you discuss any questions you have with your health care provider. Document Revised: 12/20/2021 Document Reviewed: 12/20/2021 Elsevier Patient Education  2024 [employer].   Please schedule a Follow-up Appointment to: Return if symptoms worsen or fail to improve.  If you have any other questions or concerns, please feel free to call the office or send a message through MyChart. You may also schedule an earlier appointment if necessary.  Additionally, you may be receiving a survey about your experience at our office within a few days to 1 week by e-mail or mail. We value your feedback.  Saralyn Pilar, DO [facility], [place]

## 2023-02-26 ENCOUNTER — Other Ambulatory Visit: Payer: Self-pay

## 2023-02-26 ENCOUNTER — Encounter: Payer: Self-pay | Admitting: Family Medicine

## 2023-02-26 DIAGNOSIS — L509 Urticaria, unspecified: Secondary | ICD-10-CM

## 2023-02-26 MED ORDER — CETIRIZINE HCL 10 MG PO TABS: 10.0000 mg | ORAL_TABLET | Freq: Two times a day (BID) | ORAL | 3 refills | Status: AC

## 2023-03-26 ENCOUNTER — Other Ambulatory Visit: Payer: Self-pay | Admitting: Family Medicine

## 2023-03-26 DIAGNOSIS — I10 Essential (primary) hypertension: Secondary | ICD-10-CM

## 2023-03-27 NOTE — Telephone Encounter (Signed)
Requested medications are due for refill today.  yes  Requested medications are on the active medications list.  yes  Last refill. 04/03/2022 #270 3 rf  Future visit scheduled.   yes  Notes to clinic.  Labs are expired.    Requested Prescriptions  Pending Prescriptions Disp Refills   valsartan (DIOVAN) 80 MG tablet [Pharmacy Med Name: Valsartan 80 MG Oral Tablet] 270 tablet 3    Sig: TAKE 3 TABLETS BY MOUTH DAILY     Cardiovascular:  Angiotensin Receptor Blockers Failed - 03/27/2023  9:05 AM      Failed - Cr in normal range and within 180 days    Creat  Date Value Ref Range Status  01/01/2022 1.05 0.70 - 1.35 mg/dL Final         Failed - K in normal range and within 180 days    Potassium  Date Value Ref Range Status  01/01/2022 4.8 3.5 - 5.3 mmol/L Final         Passed - Patient is not pregnant      Passed - Last BP in normal range    BP Readings from Last 1 Encounters:  01/29/23 100/60         Passed - Valid encounter within last 6 months    Recent Outpatient Visits           1 month ago Diverticulitis   [place] [facility] Smitty Cords, DO   11 months ago COVID-19   [facility] [place], Salvadore [place], NP   1 year ago Essential hypertension   [place] [facility] Smitty Cords, DO   1 year ago Essential hypertension   [place] [facility] Smitty Cords, DO   1 year ago Essential hypertension   [place] [facility] Smitty Cords, DO       Future Appointments             In 1 month Althea Charon, Netta Neat, DO  [facility], [facility]

## 2023-04-21 ENCOUNTER — Other Ambulatory Visit: Payer: Self-pay | Admitting: Family Medicine

## 2023-04-21 DIAGNOSIS — I10 Essential (primary) hypertension: Secondary | ICD-10-CM

## 2023-04-22 ENCOUNTER — Other Ambulatory Visit: Payer: 59

## 2023-04-22 DIAGNOSIS — R7309 Other abnormal glucose: Secondary | ICD-10-CM

## 2023-04-22 DIAGNOSIS — R972 Elevated prostate specific antigen [PSA]: Secondary | ICD-10-CM

## 2023-04-22 DIAGNOSIS — Z0000 Encounter for general adult medical examination without abnormal findings: Secondary | ICD-10-CM

## 2023-04-22 DIAGNOSIS — E7800 Pure hypercholesterolemia, unspecified: Secondary | ICD-10-CM

## 2023-04-22 DIAGNOSIS — I10 Essential (primary) hypertension: Secondary | ICD-10-CM

## 2023-04-23 ENCOUNTER — Encounter: Payer: Self-pay | Admitting: Family Medicine

## 2023-04-23 LAB — CBC WITH DIFFERENTIAL/PLATELET
Absolute Lymphocytes: 1658 {cells}/uL (ref 850–3900)
Absolute Monocytes: 653 {cells}/uL (ref 200–950)
Basophils Absolute: 30 {cells}/uL (ref 0–200)
Basophils Relative: 0.4 %
Eosinophils Absolute: 113 {cells}/uL (ref 15–500)
Eosinophils Relative: 1.5 %
HCT: 42.5 % (ref 38.5–50.0)
Hemoglobin: 14 g/dL (ref 13.2–17.1)
MCH: 31.5 pg (ref 27.0–33.0)
MCHC: 32.9 g/dL (ref 32.0–36.0)
MCV: 95.7 fL (ref 80.0–100.0)
MPV: 11.5 fL (ref 7.5–12.5)
Monocytes Relative: 8.7 %
Neutro Abs: 5048 {cells}/uL (ref 1500–7800)
Neutrophils Relative %: 67.3 %
Platelets: 172 10*3/uL (ref 140–400)
RBC: 4.44 10*6/uL (ref 4.20–5.80)
RDW: 12.2 % (ref 11.0–15.0)
Total Lymphocyte: 22.1 %
WBC: 7.5 10*3/uL (ref 3.8–10.8)

## 2023-04-23 LAB — COMPLETE METABOLIC PANEL WITH GFR
AG Ratio: 2.2 (calc) (ref 1.0–2.5)
ALT: 12 U/L (ref 9–46)
AST: 12 U/L (ref 10–35)
Albumin: 4.2 g/dL (ref 3.6–5.1)
Alkaline phosphatase (APISO): 63 U/L (ref 35–144)
BUN: 14 mg/dL (ref 7–25)
CO2: 30 mmol/L (ref 20–32)
Calcium: 9.3 mg/dL (ref 8.6–10.3)
Chloride: 102 mmol/L (ref 98–110)
Creat: 0.93 mg/dL (ref 0.70–1.35)
Globulin: 1.9 g/dL (ref 1.9–3.7)
Glucose, Bld: 102 mg/dL — ABNORMAL HIGH (ref 65–99)
Potassium: 4.2 mmol/L (ref 3.5–5.3)
Sodium: 139 mmol/L (ref 135–146)
Total Bilirubin: 0.4 mg/dL (ref 0.2–1.2)
Total Protein: 6.1 g/dL (ref 6.1–8.1)
eGFR: 93 mL/min/{1.73_m2} (ref >=60)

## 2023-04-23 LAB — PSA: PSA: 0.74 ng/mL (ref <=4.00)

## 2023-04-23 LAB — HEMOGLOBIN A1C
Hgb A1c MFr Bld: 5.7 %{Hb} — ABNORMAL HIGH (ref <5.7)
Mean Plasma Glucose: 117 mg/dL
eAG (mmol/L): 6.5 mmol/L

## 2023-04-23 LAB — LIPID PANEL
Cholesterol: 133 mg/dL (ref <200)
HDL: 47 mg/dL (ref >=40)
LDL Cholesterol (Calc): 67 mg/dL
Non-HDL Cholesterol (Calc): 86 mg/dL (ref <130)
Total CHOL/HDL Ratio: 2.8 (calc) (ref <5.0)
Triglycerides: 100 mg/dL (ref <150)

## 2023-04-23 LAB — TSH: TSH: 1.98 m[IU]/L (ref 0.40–4.50)

## 2023-04-23 NOTE — Telephone Encounter (Signed)
 Requested Prescriptions  Pending Prescriptions Disp Refills   rosuvastatin  (CRESTOR ) 5 MG tablet [Pharmacy Med Name: Rosuvastatin  Calcium  5 MG Oral Tablet] 90 tablet 0    Sig: TAKE 1 TABLET BY MOUTH AT  BEDTIME     Cardiovascular:  Antilipid - Statins 2 Failed - 04/23/2023  8:10 AM      Failed - Lipid Panel in normal range within the last 12 months    Cholesterol  Date Value Ref Range Status  04/22/2023 133 <200 mg/dL Final   LDL Cholesterol (Calc)  Date Value Ref Range Status  04/22/2023 67 mg/dL (calc) Final    Comment:    Reference range: <100 . Desirable range <100 mg/dL for primary prevention;   <70 mg/dL for patients with CHD or diabetic patients  with > or = 2 CHD risk factors. Aaron Aas LDL-C is now calculated using the Martin-Hopkins  calculation, which is a validated novel method providing  better accuracy than the Friedewald equation in the  estimation of LDL-C.  Melinda Sprawls et al. Erroll Heard. 1610;960(45): 2061-2068  (http://education.[employer].com/faq/FAQ164)    HDL  Date Value Ref Range Status  04/22/2023 47 > OR = 40 mg/dL Final   Triglycerides  Date Value Ref Range Status  04/22/2023 100 <150 mg/dL Final         Passed - Cr in normal range and within 360 days    Creat  Date Value Ref Range Status  04/22/2023 0.93 0.70 - 1.35 mg/dL Final         Passed - Patient is not pregnant      Passed - Valid encounter within last 12 months    Recent Outpatient Visits           2 months ago Diverticulitis   [place] [facility] Raina Bunting, DO   1 year ago COVID-19   [facility] [place], Rankin Buzzard, NP   1 year ago Essential hypertension   [place] [facility] Raina Bunting, DO   1 year ago Essential hypertension   [place] [facility] Raina Bunting, DO   1 year ago Essential hypertension   [place] [facility] [place],  Kayleen Party, DO       Future Appointments             In 6 days [place], Kayleen Party, DO [facility]             amLODipine  (NORVASC ) 5 MG tablet [Pharmacy Med Name: amLODIPine  Besylate 5 MG Oral Tablet] 90 tablet 0    Sig: TAKE 1 TABLET BY MOUTH DAILY     Cardiovascular: Calcium  Channel Blockers 2 Passed - 04/23/2023  8:10 AM      Passed - Last BP in normal range    BP Readings from Last 1 Encounters:  01/29/23 100/60         Passed - Last Heart Rate in normal range    Pulse Readings from Last 1 Encounters:  04/11/22 94         Passed - Valid encounter within last 6 months    Recent Outpatient Visits           2 months ago Diverticulitis    [facility] Raina Bunting, DO   1 year ago COVID-19   [facility] [place], Rankin Buzzard, NP   1 year ago Essential hypertension  [place] [facility] [place], Kayleen Party, DO   1 year ago Essential hypertension   [place] [facility] Raina Bunting, DO   1 year ago Essential hypertension   [place] [facility] [place], Kayleen Party, DO       Future Appointments             In 6 days [place], Kayleen Party, DO [place] [facility], [place]

## 2023-04-29 ENCOUNTER — Other Ambulatory Visit: Payer: Self-pay | Admitting: Family Medicine

## 2023-04-29 ENCOUNTER — Ambulatory Visit (INDEPENDENT_AMBULATORY_CARE_PROVIDER_SITE_OTHER): Payer: 59 | Admitting: Family Medicine

## 2023-04-29 VITALS — BP 120/68 | HR 71 | Ht 72.0 in | Wt 191.0 lb

## 2023-04-29 DIAGNOSIS — Z91018 Allergy to other foods: Secondary | ICD-10-CM

## 2023-04-29 DIAGNOSIS — I10 Essential (primary) hypertension: Secondary | ICD-10-CM

## 2023-04-29 DIAGNOSIS — R972 Elevated prostate specific antigen [PSA]: Secondary | ICD-10-CM

## 2023-04-29 DIAGNOSIS — N529 Male erectile dysfunction, unspecified: Secondary | ICD-10-CM | POA: Diagnosis not present

## 2023-04-29 DIAGNOSIS — E7800 Pure hypercholesterolemia, unspecified: Secondary | ICD-10-CM

## 2023-04-29 DIAGNOSIS — R7309 Other abnormal glucose: Secondary | ICD-10-CM

## 2023-04-29 DIAGNOSIS — Z0000 Encounter for general adult medical examination without abnormal findings: Secondary | ICD-10-CM

## 2023-04-29 MED ORDER — AMLODIPINE BESYLATE 5 MG PO TABS: 5.0000 mg | ORAL_TABLET | Freq: Every day | ORAL | 3 refills | Status: DC

## 2023-04-29 MED ORDER — ROSUVASTATIN CALCIUM 5 MG PO TABS: 5.0000 mg | ORAL_TABLET | Freq: Every day | ORAL | 3 refills | Status: DC

## 2023-04-29 MED ORDER — TADALAFIL 5 MG PO TABS: 5.0000 mg | ORAL_TABLET | Freq: Every day | ORAL | 3 refills | Status: AC

## 2023-04-29 NOTE — Progress Notes (Signed)
Subjective:    Patient ID: Lucas Lopez, male    DOB: [birth year], 63 y.o.   MRN: [id number]  Lucas Lopez is a 63 y.o. male presenting on 04/29/2023 for Annual Exam   HPI  Discussed the use of AI scribe software for clinical note transcription with the patient, who gave verbal consent to proceed.  History of Present Illness     The patient, with a history of hypertension and hyperlipidemia, here for annual physical  Diverticulitis - resolved Update The patient reported a significant improvement in chronic dark, runny stools following an antibiotic treatment Augmentin prescribed prior to the colonoscopy. The patient also reported a history of occasional urgency in urination, which has improved with reduced caffeine intake.  Elevated A1c The patient has a family history of diabetes and heart disease, with a mother who died of a heart attack at the age of 6.  Recent lab results showed a slight increase in A1C levels to 5.7, indicating a progression towards prediabetes.  The patient is aware of this and is actively monitoring his weight and diet to manage this risk.  Hyperlipidemia Lab shows LDL 67 - On Rosuvastatin 5mg  daily, needs refills  Hypertension Home BP monitoring Controlled on Amlodipine 5mg  daily  The patient also reported occasional use of earplugs due to scooter riding and nighttime use, which has caused some minor discomfort. However, there are no visible issues with the ears.   The patient also reported a past issue with medication side effect and hives, which have since resolved and are no longer a concern.  The patient also uses a nasal spray (mometasone) without alcohol due to past throat issues. The patient has a prescription for cromolyn solution oral for occasional inflammation.    Health Maintenance:  Colonoscopy 03/20/23 - [facility] had 1 hyperplastic polyp, negative, repeat 5-10 years, noted diverticulosis  Interested in future Pneumonia vaccine  in future.  The patient has also received the flu and COVID-19 booster vaccines and is up-to-date with the shingles vaccine. The patient expressed interest in receiving the RSV vaccine, but does not currently meet the criteria for it.     10/25/2021    9:16 AM 08/15/2021   10:42 AM 10/17/2020    1:27 PM  Depression screen PHQ 2/9  Decreased Interest 0 0 0  Down, Depressed, Hopeless 0 0 0  PHQ - 2 Score 0 0 0  Altered sleeping 1 1 0  Tired, decreased energy 1 1 1   Change in appetite 1 1 2   Feeling bad or failure about yourself  0 0 0  Trouble concentrating 0 0 0  Moving slowly or fidgety/restless 0 0 0  Suicidal thoughts 0 0 0  PHQ-9 Score 3 3 3   Difficult doing work/chores Not difficult at all Not difficult at all Not difficult at all       10/25/2021    9:16 AM 08/15/2021   10:43 AM 10/17/2020    1:27 PM  GAD 7 : Generalized Anxiety Score  Nervous, Anxious, on Edge 1 1 0  Control/stop worrying 0 0 0  Worry too much - different things 0 0 0  Trouble relaxing 1 1 0  Restless 0 0 0  Easily annoyed or irritable 1 1 0  Afraid - awful might happen 0 0 0  Total GAD 7 Score 3 3 0  Anxiety Difficulty Not difficult at all Not difficult at all Not difficult at all     Past Medical History:  Diagnosis Date  Diverticulitis    Eczema    Gout    In the left most 3 toes of left foot   Vitiligo    History reviewed. No pertinent surgical history. 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)  Tobacco Use   Smoking status: Former    Current packs/day: 1.00    Average packs/day: 1 pack/day for 13.0 years (13.0 ttl pk-yrs)    Types: Cigarettes   Smokeless tobacco: Former  Substance and Sexual Activity    Alcohol use: Not Currently   Drug use: Not Currently    Types: Marijuana   Sexual activity: Not on file  Other Topics Concern   Not on file  Social History Narrative   Not on file   Social Drivers of Health   Financial Resource Strain: Low Risk  (04/29/2023)   Overall Financial Resource Strain (CARDIA)    Difficulty of Paying Living Expenses: Not hard at all  Food Insecurity: No Food Insecurity (04/29/2023)   Hunger Vital Sign    Worried About Running Out of Food in the Last Year: Never true    Ran Out of Food in the Last Year: Never true  Transportation Needs: No Transportation Needs (04/29/2023)   PRAPARE - Administrator, Civil Service (Medical): No    Lack of Transportation (Non-Medical): No  Physical Activity: Sufficiently Active (04/29/2023)   Exercise Vital Sign    Days of Exercise per Week: 3 days    Minutes of Exercise per Session: 100 min  Stress: No Stress Concern Present (04/29/2023)   [employer] of Occupational Health - Occupational Stress Questionnaire    Feeling of Stress : Not at all  Social Connections: Moderately Isolated (04/29/2023)   Social Connection and Isolation Panel [NHANES]    Frequency of Communication with Friends and Family: Never    Frequency of Social Gatherings with Friends and Family: Never    Attends Religious Services: Never    Database administrator or Organizations: Yes    Attends Engineer, structural: More than 4 times per year    Marital Status: Married  Catering manager Violence: Not At Risk (07/06/2019)   [facility], [facility]   Humiliation, Afraid, Rape, and Kick questionnaire    Fear of Current or Ex-Partner: No    Emotionally Abused: No    Physically Abused: No    Sexually Abused: No   Family History  Problem Relation Age of Onset   Hypertension Mother    Heart attack Mother 59   Fibroids Mother    Pneumonia Father 22   Hyperlipidemia Father    Hypertension Father     Osteoarthritis Father    Psoriasis Sister    Skin cancer Sister    Current Outpatient Medications on File Prior to Visit  Medication Sig   cetirizine (ZYRTEC) 10 MG tablet Take 1 tablet (10 mg total) by mouth 2 (two) times daily.   cromolyn (GASTROCROM) 100 MG/5ML solution Take 200 mg by mouth 4 (four) times daily as needed.   cromolyn (NASALCROM) 5.2 MG/ACT nasal spray Place 1 spray into both nostrils daily as needed.   EPINEPHrine (EPIPEN 2-PAK) 0.3 mg/0.3 mL  IJ SOAJ injection Inject 0.3 mg into the muscle as needed for anaphylaxis.   mometasone (NASONEX) 50 MCG/ACT nasal spray Place 1 spray into the nose in the morning and at bedtime.   omeprazole (PRILOSEC) 20 MG capsule Take 1 capsule (20 mg total) by mouth daily as needed.   valsartan (DIOVAN) 80 MG tablet TAKE 3 TABLETS BY MOUTH DAILY   No current facility-administered medications on file prior to visit.    Review of Systems  Constitutional:  Negative for activity change, appetite change, chills, diaphoresis, fatigue and fever.  HENT:  Negative for congestion and hearing loss.   Eyes:  Negative for visual disturbance.  Respiratory:  Negative for cough, chest tightness, shortness of breath and wheezing.   Cardiovascular:  Negative for chest pain, palpitations and leg swelling.  Gastrointestinal:  Negative for abdominal pain, constipation, diarrhea, nausea and vomiting.  Genitourinary:  Negative for dysuria, frequency and hematuria.  Musculoskeletal:  Negative for arthralgias and neck pain.  Skin:  Negative for rash.  Neurological:  Negative for dizziness, weakness, light-headedness, numbness and headaches.  Hematological:  Negative for adenopathy.  Psychiatric/Behavioral:  Negative for behavioral problems, dysphoric mood and sleep disturbance.    Per HPI unless specifically indicated above     Objective:    BP 120/68   Pulse 71   Ht 6' (1.829 m)   Wt 191 lb (86.6 kg)   SpO2 98%   BMI 25.90 kg/m   Wt Readings from Last  3 Encounters:  04/29/23 191 lb (86.6 kg)  01/29/23 182 lb (82.6 kg)  04/11/22 186 lb (84.4 kg)    Physical Exam Vitals and nursing note reviewed.  Constitutional:      General: He is not in acute distress.    Appearance: He is well-developed. He is not diaphoretic.     Comments: Well-appearing, comfortable, cooperative  HENT:     Head: Normocephalic and atraumatic.  Eyes:     General:        Right eye: No discharge.        Left eye: No discharge.     Conjunctiva/sclera: Conjunctivae normal.     Pupils: Pupils are equal, round, and reactive to light.  Neck:     Thyroid: No thyromegaly.     Vascular: No carotid bruit.  Cardiovascular:     Rate and Rhythm: Normal rate and regular rhythm.     Pulses: Normal pulses.     Heart sounds: Normal heart sounds. No murmur heard. Pulmonary:     Effort: Pulmonary effort is normal. No respiratory distress.     Breath sounds: Normal breath sounds. No wheezing or rales.  Abdominal:     General: Bowel sounds are normal. There is no distension.     Palpations: Abdomen is soft. There is no mass.     Tenderness: There is no abdominal tenderness.  Musculoskeletal:        General: No tenderness. Normal range of motion.     Cervical back: Normal range of motion and neck supple.     Right lower leg: No edema.     Left lower leg: No edema.     Comments: Upper / Lower Extremities: - Normal muscle tone, strength bilateral upper extremities 5/5, lower extremities 5/5  Lymphadenopathy:     Cervical: No cervical adenopathy.  Skin:    General: Skin is warm and dry.     Findings: No erythema or rash.  Neurological:     Mental Status: He is alert and oriented  to person, place, and time.     Comments: Distal sensation intact to light touch all extremities  Psychiatric:        Mood and Affect: Mood normal.        Behavior: Behavior normal.        Thought Content: Thought content normal.     Comments: Well groomed, good eye contact, normal speech and  thoughts     Results for orders placed or performed in visit on 04/22/23  TSH   Collection Time: 04/22/23  8:29 AM  Result Value Ref Range   TSH 1.98 0.40 - 4.50 mIU/L  PSA   Collection Time: 04/22/23  8:29 AM  Result Value Ref Range   PSA 0.74 < OR = 4.00 ng/mL  CBC with Differential/Platelet   Collection Time: 04/22/23  8:29 AM  Result Value Ref Range   WBC 7.5 3.8 - 10.8 Thousand/uL   RBC 4.44 4.20 - 5.80 Million/uL   Hemoglobin 14.0 13.2 - 17.1 g/dL   HCT 16.1 09.6 - 04.5 %   MCV 95.7 80.0 - 100.0 fL   MCH 31.5 27.0 - 33.0 pg   MCHC 32.9 32.0 - 36.0 g/dL   RDW 40.9 81.1 - 91.4 %   Platelets 172 140 - 400 Thousand/uL   MPV 11.5 7.5 - 12.5 fL   Neutro Abs 5,048 1,500 - 7,800 cells/uL   Absolute Lymphocytes 1,658 850 - 3,900 cells/uL   Absolute Monocytes 653 200 - 950 cells/uL   Eosinophils Absolute 113 15 - 500 cells/uL   Basophils Absolute 30 0 - 200 cells/uL   Neutrophils Relative % 67.3 %   Total Lymphocyte 22.1 %   Monocytes Relative 8.7 %   Eosinophils Relative 1.5 %   Basophils Relative 0.4 %  COMPLETE METABOLIC PANEL WITH GFR   Collection Time: 04/22/23  8:29 AM  Result Value Ref Range   Glucose, Bld 102 (H) 65 - 99 mg/dL   BUN 14 7 - 25 mg/dL   Creat 7.82 9.56 - 2.13 mg/dL   eGFR 93 > OR = 60 YQ/MVH/8.46N6   BUN/Creatinine Ratio SEE NOTE: 6 - 22 (calc)   Sodium 139 135 - 146 mmol/L   Potassium 4.2 3.5 - 5.3 mmol/L   Chloride 102 98 - 110 mmol/L   CO2 30 20 - 32 mmol/L   Calcium 9.3 8.6 - 10.3 mg/dL   Total Protein 6.1 6.1 - 8.1 g/dL   Albumin 4.2 3.6 - 5.1 g/dL   Globulin 1.9 1.9 - 3.7 g/dL (calc)   AG Ratio 2.2 1.0 - 2.5 (calc)   Total Bilirubin 0.4 0.2 - 1.2 mg/dL   Alkaline phosphatase (APISO) 63 35 - 144 U/L   AST 12 10 - 35 U/L   ALT 12 9 - 46 U/L  Lipid panel   Collection Time: 04/22/23  8:29 AM  Result Value Ref Range   Cholesterol 133 <200 mg/dL   HDL 47 > OR = 40 mg/dL   Triglycerides 295 <284 mg/dL   LDL Cholesterol (Calc) 67 mg/dL  (calc)   Total CHOL/HDL Ratio 2.8 <5.0 (calc)   Non-HDL Cholesterol (Calc) 86 <132 mg/dL (calc)  Hemoglobin G4W   Collection Time: 04/22/23  8:29 AM  Result Value Ref Range   Hgb A1c MFr Bld 5.7 (H) <5.7 % of total Hgb   Mean Plasma Glucose 117 mg/dL   eAG (mmol/L) 6.5 mmol/L      Assessment & Plan:   Problem List Items Addressed This Visit  Erectile dysfunction   Relevant Medications   tadalafil (CIALIS) 5 MG tablet   Essential hypertension   Relevant Medications   amLODipine (NORVASC) 5 MG tablet   rosuvastatin (CRESTOR) 5 MG tablet   tadalafil (CIALIS) 5 MG tablet   Other Relevant Orders   CT CARDIAC SCORING (SELF PAY ONLY)   Pure hypercholesterolemia   Relevant Medications   amLODipine (NORVASC) 5 MG tablet   rosuvastatin (CRESTOR) 5 MG tablet   tadalafil (CIALIS) 5 MG tablet   Other Relevant Orders   CT CARDIAC SCORING (SELF PAY ONLY)   Other Visit Diagnoses       Annual physical exam    -  Primary     Allergy to alpha-gal            Updated Health Maintenance information Reviewed recent lab results with patient Encouraged improvement to lifestyle with diet and exercise   History of Diverticulitis - resolved Previous complaints of dark, runny stools resolved with antibiotic treatment prior to colonoscopy. Colonoscopy on 04/01/2023 revealed one hyperplastic polyp and diverticulosis. -Continue current management. If symptoms of dark, runny stools return, consider another course of antibiotics.  Reviewed immunization recommendations  Prediabetes Gradual increase in A1c over the past few years, currently at 5.7. Fasting glucose slightly elevated. Patient aware of potential progression to diabetes in future and is monitoring diet and lifestyle. -Check A1c in six months. Continue lifestyle modifications to manage prediabetes.  Hypertension Controlled on Amlodipine 5mg  daily -Refill amlodipine 5mg  prescription. 90 + 3 refills  Hyperlipidemia Controlled  LDL 67 -Refill rosuvastatin 5mg  prescription 90 + 3 refills  Cardiovascular Health Fam history heart disease Order Coronary Calcium Score CT screening     Orders Placed This Encounter  Procedures   CT CARDIAC SCORING (SELF PAY ONLY)    Standing Status:   Future    Expiration Date:   04/28/2024    Preferred imaging location?:   [facility]    Meds ordered this encounter  Medications   amLODipine (NORVASC) 5 MG tablet    Sig: Take 1 tablet (5 mg total) by mouth daily.    Dispense:  90 tablet    Refill:  3    1 year supply, add future refills   rosuvastatin (CRESTOR) 5 MG tablet    Sig: Take 1 tablet (5 mg total) by mouth at bedtime.    Dispense:  90 tablet    Refill:  3    Requesting 1 year supply. Add future refills   tadalafil (CIALIS) 5 MG tablet    Sig: Take 1 tablet (5 mg total) by mouth daily.    Dispense:  90 tablet    Refill:  3     Follow up plan: Return in about 1 year (around 04/28/2024) for 1 year fasting lab > 1 week later Annual Physical.  Future labs 04/28/24  Saralyn Pilar, DO [facility] 04/29/2023, 1:34 PM

## 2023-04-29 NOTE — Patient Instructions (Addendum)
Thank you for coming to the office today.  Recent Labs    04/22/23 0829  HGBA1C 5.7*   Goal to keep improving lifestyle diet to limit sugar and carb starch intake.  ---------------------------------  You have been referred for a Coronary Calcium Score Cardiac CT Scan. This is a screening test for patients aged 62-50+ with cardiovascular risk factors or who are healthy but would be interested in Cardiovascular Screening for heart disease. Even if there is a family history of heart disease, this imaging can be useful. Typically it can be done every 5+ years or at a different timeline we agree on  The scan will look at the chest and mainly focus on the heart and identify early signs of calcium build up or blockages within the heart arteries. It is not 100% accurate for identifying blockages or heart disease, but it is useful to help [place] predict who may have some early changes or be at risk in the future for a heart attack or cardiovascular problem.  The results are reviewed by a Cardiologist and they will document the results. It should become available on MyChart. Typically the results are divided into percentiles based on other patients of the same demographic and age. So it will compare your risk to others similar to you. If you have a higher score >99 or higher percentile >75%tile, it is recommended to consider Statin cholesterol therapy and or referral to Cardiologist. I will try to help explain your results and if we have questions we can contact the Cardiologist.  You will be contacted for scheduling. Usually it is done at any imaging facility through [facility], [facility] or [facility].  The cost is $99 flat fee total and it does not go through insurance, so no authorization is required.  DUE for FASTING BLOOD WORK (no food or drink after midnight before the lab appointment, only water or coffee without cream/sugar on the morning of)  SCHEDULE "Lab Only"  visit in the morning at the clinic for lab draw in 1 YEAR  - Make sure Lab Only appointment is at about 1 week before your next appointment, so that results will be available  For Lab Results, once available within 2-3 days of blood draw, you can can log in to MyChart online to view your results and a brief explanation. Also, we can discuss results at next follow-up visit.   Please schedule a Follow-up Appointment to: Return in about 1 year (around 04/28/2024) for 1 year fasting lab > 1 week later Annual Physical.  If you have any other questions or concerns, please feel free to call the office or send a message through MyChart. You may also schedule an earlier appointment if necessary.  Additionally, you may be receiving a survey about your experience at our office within a few days to 1 week by e-mail or mail. We value your feedback.  Saralyn Pilar, DO [facility], [place]

## 2023-04-30 ENCOUNTER — Encounter: Payer: 59 | Admitting: Family Medicine

## 2023-05-08 ENCOUNTER — Ambulatory Visit
Admission: RE | Admit: 2023-05-08 | Discharge: 2023-05-08 | Disposition: A | Discharge status: Home / Self Care | Payer: Self-pay | Source: Ambulatory Visit | Attending: Family Medicine | Admitting: Family Medicine

## 2023-05-08 ENCOUNTER — Encounter: Payer: Self-pay | Admitting: Family Medicine

## 2023-05-08 DIAGNOSIS — I10 Essential (primary) hypertension: Secondary | ICD-10-CM

## 2023-05-08 DIAGNOSIS — E7800 Pure hypercholesterolemia, unspecified: Secondary | ICD-10-CM

## 2023-05-20 ENCOUNTER — Encounter: Payer: Self-pay | Admitting: Family Medicine

## 2023-05-20 ENCOUNTER — Ambulatory Visit: Payer: 59 | Admitting: Family Medicine

## 2023-05-20 VITALS — BP 132/74 | HR 71 | Ht 72.0 in | Wt 184.0 lb

## 2023-05-20 DIAGNOSIS — R1031 Right lower quadrant pain: Secondary | ICD-10-CM | POA: Diagnosis not present

## 2023-05-20 NOTE — Progress Notes (Signed)
Subjective:    Patient ID: Lucas Lopez, male    DOB: 1960/09/25, 63 y.o.   MRN: [id number]  Lucas Lopez is a 63 y.o. male presenting on 05/20/2023 for Abdominal Pain   HPI  Discussed the use of AI scribe software for clinical note transcription with the patient, who gave verbal consent to proceed.  History of Present Illness    Lucas Lopez is a 63 year old male who presents with recurrent abdominal pain and diarrhea.  He experiences recurrent abdominal pain described as an 'aggravation' rather than sharp, located on the right side of his abdomen. The pain sometimes occurs without diarrhea, which he describes as 'explosive.' He has been tracking these episodes, noting they do not always coincide. - History of Diverticulitis vs Colitis recently treated by me on 01/29/23 and resolved w/ Augmentin. Referred to GI  During a previous episode, antibiotics resolved both the diarrhea and abdominal pain, but the pain has since returned. He has a history of a clear colonoscopy ([facility] 03/2023) and was initially placed on a ten-year schedule, which was adjusted to five years.   No history of inguinal or abdominal hernia. A past epididymal cyst diagnosed in 2020 on scrotal US seems unrelated, but he reports this finding.  He engages in regular physical activity, including running and cycling, which can sometimes increase the sensitivity of the pain. He cycles three times a week and has adjusted his equipment to minimize discomfort. The pain can be aggravated by certain activities and sometimes radiates to the lower extremity, exacerbated by pressure or certain movements.       10/25/2021    9:16 AM 08/15/2021   10:42 AM 10/17/2020    1:27 PM  Depression screen PHQ 2/9  Decreased Interest 0 0 0  Down, Depressed, Hopeless 0 0 0  PHQ - 2 Score 0 0 0  Altered sleeping 1 1 0  Tired, decreased energy 1 1 1   Change in appetite 1 1 2   Feeling bad or failure about yourself  0 0 0   Trouble concentrating 0 0 0  Moving slowly or fidgety/restless 0 0 0  Suicidal thoughts 0 0 0  PHQ-9 Score 3 3 3   Difficult doing work/chores Not difficult at all Not difficult at all Not difficult at all       10/25/2021    9:16 AM 08/15/2021   10:43 AM 10/17/2020    1:27 PM  GAD 7 : Generalized Anxiety Score  Nervous, Anxious, on Edge 1 1 0  Control/stop worrying 0 0 0  Worry too much - different things 0 0 0  Trouble relaxing 1 1 0  Restless 0 0 0  Easily annoyed or irritable 1 1 0  Afraid - awful might happen 0 0 0  Total GAD 7 Score 3 3 0  Anxiety Difficulty Not difficult at all Not difficult at all Not difficult at all    Social History   Tobacco Use   Smoking status: Former    Current packs/day: 1.00    Average packs/day: 1 pack/day for 13.0 years (13.0 ttl pk-yrs)    Types: Cigarettes   Smokeless tobacco: Former  Substance Use Topics   Alcohol use: Not Currently   Drug use: Not Currently    Types: Marijuana    Review of Systems Per HPI unless specifically indicated above     Objective:    BP 132/74   Pulse 71   Ht 6' (1.829 m)   Wt 184 lb (  83.5 kg)   SpO2 95%   BMI 24.95 kg/m   Wt Readings from Last 3 Encounters:  05/20/23 184 lb (83.5 kg)  04/29/23 191 lb (86.6 kg)  01/29/23 182 lb (82.6 kg)    Physical Exam Vitals and nursing note reviewed.  Constitutional:      General: He is not in acute distress.    Appearance: Normal appearance. He is well-developed. He is not diaphoretic.     Comments: Well-appearing, comfortable, cooperative  HENT:     Head: Normocephalic and atraumatic.  Eyes:     General:        Right eye: No discharge.        Left eye: No discharge.     Conjunctiva/sclera: Conjunctivae normal.  Cardiovascular:     Rate and Rhythm: Normal rate.  Pulmonary:     Effort: Pulmonary effort is normal.  Abdominal:     General: Bowel sounds are normal. There is no distension.     Palpations: Abdomen is soft. There is no mass.      Tenderness: There is no abdominal tenderness. There is no guarding or rebound.     Hernia: No hernia (No inguinal hernia on exam w valsalva) is present.     Comments: Negative RUQ abdominal pain. Negative Murphy Positive RLQ abdominal pain, positive McBurney No rebound or radiating pain in abdomen. Sitting up from exam bed does cause some discomfort in this area. No provoked herniation of abdomen / ventral on sitting up  Skin:    General: Skin is warm and dry.     Findings: No erythema or rash.  Neurological:     Mental Status: He is alert and oriented to person, place, and time.  Psychiatric:        Mood and Affect: Mood normal.        Behavior: Behavior normal.        Thought Content: Thought content normal.     Comments: Well groomed, good eye contact, normal speech and thoughts   Notes from reported scrotal US from 2020, no results available on chart. 6.0 mm complex cyst of L epididymal head, 2019, 9/13, 2.0 cm calcification of lest testicle 3.1 mm varicocele  Left  Results for orders placed or performed in visit on 04/22/23  TSH   Collection Time: 04/22/23  8:29 AM  Result Value Ref Range   TSH 1.98 0.40 - 4.50 mIU/L  PSA   Collection Time: 04/22/23  8:29 AM  Result Value Ref Range   PSA 0.74 < OR = 4.00 ng/mL  CBC with Differential/Platelet   Collection Time: 04/22/23  8:29 AM  Result Value Ref Range   WBC 7.5 3.8 - 10.8 Thousand/uL   RBC 4.44 4.20 - 5.80 Million/uL   Hemoglobin 14.0 13.2 - 17.1 g/dL   HCT 40.9 81.1 - 91.4 %   MCV 95.7 80.0 - 100.0 fL   MCH 31.5 27.0 - 33.0 pg   MCHC 32.9 32.0 - 36.0 g/dL   RDW 78.2 95.6 - 21.3 %   Platelets 172 140 - 400 Thousand/uL   MPV 11.5 7.5 - 12.5 fL   Neutro Abs 5,048 1,500 - 7,800 cells/uL   Absolute Lymphocytes 1,658 850 - 3,900 cells/uL   Absolute Monocytes 653 200 - 950 cells/uL   Eosinophils Absolute 113 15 - 500 cells/uL   Basophils Absolute 30 0 - 200 cells/uL   Neutrophils Relative % 67.3 %   Total Lymphocyte  22.1 %   Monocytes Relative 8.7 %  Eosinophils Relative 1.5 %   Basophils Relative 0.4 %  COMPLETE METABOLIC PANEL WITH GFR   Collection Time: 04/22/23  8:29 AM  Result Value Ref Range   Glucose, Bld 102 (H) 65 - 99 mg/dL   BUN 14 7 - 25 mg/dL   Creat 7.82 9.56 - 2.13 mg/dL   eGFR 93 > OR = 60 YQ/MVH/8.46N6   BUN/Creatinine Ratio SEE NOTE: 6 - 22 (calc)   Sodium 139 135 - 146 mmol/L   Potassium 4.2 3.5 - 5.3 mmol/L   Chloride 102 98 - 110 mmol/L   CO2 30 20 - 32 mmol/L   Calcium 9.3 8.6 - 10.3 mg/dL   Total Protein 6.1 6.1 - 8.1 g/dL   Albumin 4.2 3.6 - 5.1 g/dL   Globulin 1.9 1.9 - 3.7 g/dL (calc)   AG Ratio 2.2 1.0 - 2.5 (calc)   Total Bilirubin 0.4 0.2 - 1.2 mg/dL   Alkaline phosphatase (APISO) 63 35 - 144 U/L   AST 12 10 - 35 U/L   ALT 12 9 - 46 U/L  Lipid panel   Collection Time: 04/22/23  8:29 AM  Result Value Ref Range   Cholesterol 133 <200 mg/dL   HDL 47 > OR = 40 mg/dL   Triglycerides 295 <284 mg/dL   LDL Cholesterol (Calc) 67 mg/dL (calc)   Total CHOL/HDL Ratio 2.8 <5.0 (calc)   Non-HDL Cholesterol (Calc) 86 <132 mg/dL (calc)  Hemoglobin G4W   Collection Time: 04/22/23  8:29 AM  Result Value Ref Range   Hgb A1c MFr Bld 5.7 (H) <5.7 % of total Hgb   Mean Plasma Glucose 117 mg/dL   eAG (mmol/L) 6.5 mmol/L      Assessment & Plan:   Problem List Items Addressed This Visit   None Visit Diagnoses       Right lower quadrant abdominal pain    -  Primary   Relevant Orders   CT ABDOMEN PELVIS W CONTRAST        Chronic Abdominal Pain RLQ Recurrent lower abdominal pain, previously improved with antibiotics. No clear correlation with diarrhea episodes Exam is benign abdomen with distinct RLQ pain on palpation. No evidence of hernia on physical examination but has R inguinal spermatic cord region pain that radiates to RLQ. Possible musculoskeletal etiology given cycling history  Recent Colonoscopy 03/20/23 [facility] - includes eval of appendix opening without  abnormality. No evidence of colitis.  Last labs reviewed 04/2023 including CBC normal WBC count . -Order CT abdomen/pelvis with contrast to further evaluate. -Based on results, consider referral to General Surgery or Sports Medicine for further evaluation.    Orders Placed This Encounter  Procedures   CT ABDOMEN PELVIS W CONTRAST    Standing Status:   Future    Expiration Date:   05/19/2024    If indicated for the ordered procedure, I authorize the administration of contrast media per Radiology protocol:   Yes    Does the patient have a contrast media/X-ray dye allergy?:   No    Preferred imaging location?:   [facility]    If indicated for the ordered procedure, I authorize the administration of oral contrast media per Radiology protocol:   Yes    No orders of the defined types were placed in this encounter.   Follow up plan: Return if symptoms worsen or fail to improve.    Saralyn Pilar, DO [facility] 05/20/2023, 3:29 PM

## 2023-05-20 NOTE — Patient Instructions (Addendum)
Thank you for coming to the office today.  CT Abdomen Pelvis w/ contrast ordered, stay tuned for scheduling  Should be at [facility]   Based on results, likely referral to General Surgery  [facility] [address] [place],  [place]  [address] Phone: [phone]  IF the history changes or the scan absolutely does not show any concerns that could be surgical, then we could consider the Sports Medicine specialist Dr Ashley Royalty in [place].  Please schedule a Follow-up Appointment to: Return if symptoms worsen or fail to improve.  If you have any other questions or concerns, please feel free to call the office or send a message through MyChart. You may also schedule an earlier appointment if necessary.  Additionally, you may be receiving a survey about your experience at our office within a few days to 1 week by e-mail or mail. We value your feedback.  Saralyn Pilar, DO [facility] [address], [place]

## 2023-05-22 ENCOUNTER — Ambulatory Visit
Admission: RE | Admit: 2023-05-22 | Discharge: 2023-05-22 | Disposition: A | Discharge status: Home / Self Care | Payer: 59 | Source: Ambulatory Visit | Attending: Family Medicine | Admitting: Family Medicine

## 2023-05-22 DIAGNOSIS — R1031 Right lower quadrant pain: Secondary | ICD-10-CM | POA: Insufficient documentation

## 2023-05-22 HISTORY — DX: Essential (primary) hypertension: I10

## 2023-05-22 MED ORDER — IOHEXOL 300 MG/ML  SOLN
100.0000 mL | Freq: Once | INTRAMUSCULAR | Status: AC | PRN
  Administered 2023-05-22: 100 mL via INTRAVENOUS

## 2023-05-28 ENCOUNTER — Ambulatory Visit: Admission: RE | Admit: 2023-05-28 | Payer: 59 | Source: Ambulatory Visit

## 2023-05-31 ENCOUNTER — Encounter: Payer: Self-pay | Admitting: Family Medicine

## 2023-07-15 ENCOUNTER — Telehealth: Payer: Self-pay

## 2023-07-15 NOTE — Telephone Encounter (Addendum)
 [facility] (Key: [id number]) Rx #: [id number] Need Help? Call us at [phone] Archived on April 7 by [id number] [facility] Status Archived (unknown outcome) Drug Tadalafil 5MG  tablets ePA cloud logo Form [facility] Electronic Prior Authorization Form (2017 NCPDP) Original Claim Info 75  Archived by [facility]. No further action required

## 2023-07-30 ENCOUNTER — Encounter: Payer: Self-pay | Admitting: Family Medicine

## 2023-07-30 DIAGNOSIS — H6993 Unspecified Eustachian tube disorder, bilateral: Secondary | ICD-10-CM

## 2023-07-30 DIAGNOSIS — J329 Chronic sinusitis, unspecified: Secondary | ICD-10-CM

## 2023-07-30 MED ORDER — MOMETASONE FUROATE 50 MCG/ACT NA SUSP: NASAL | 3 refills | Status: DC

## 2023-11-10 ENCOUNTER — Other Ambulatory Visit: Payer: Self-pay

## 2023-11-10 DIAGNOSIS — H6993 Unspecified Eustachian tube disorder, bilateral: Secondary | ICD-10-CM

## 2023-11-10 DIAGNOSIS — J329 Chronic sinusitis, unspecified: Secondary | ICD-10-CM

## 2023-11-10 MED ORDER — MOMETASONE FUROATE 50 MCG/ACT NA SUSP: NASAL | 3 refills | Status: AC

## 2024-02-17 ENCOUNTER — Other Ambulatory Visit

## 2024-02-17 DIAGNOSIS — R972 Elevated prostate specific antigen [PSA]: Secondary | ICD-10-CM

## 2024-02-17 DIAGNOSIS — E7800 Pure hypercholesterolemia, unspecified: Secondary | ICD-10-CM

## 2024-02-17 DIAGNOSIS — Z0000 Encounter for general adult medical examination without abnormal findings: Secondary | ICD-10-CM

## 2024-02-17 DIAGNOSIS — R7309 Other abnormal glucose: Secondary | ICD-10-CM

## 2024-02-17 DIAGNOSIS — I10 Essential (primary) hypertension: Secondary | ICD-10-CM

## 2024-02-18 LAB — COMPLETE METABOLIC PANEL WITHOUT GFR
AG Ratio: 2.6 (calc) — ABNORMAL HIGH (ref 1.0–2.5)
ALT: 22 U/L (ref 9–46)
AST: 18 U/L (ref 10–35)
Albumin: 4.7 g/dL (ref 3.6–5.1)
Alkaline phosphatase (APISO): 56 U/L (ref 35–144)
BUN: 10 mg/dL (ref 7–25)
CO2: 28 mmol/L (ref 20–32)
Calcium: 9.6 mg/dL (ref 8.6–10.3)
Chloride: 103 mmol/L (ref 98–110)
Creat: 0.89 mg/dL (ref 0.70–1.35)
Globulin: 1.8 g/dL — ABNORMAL LOW (ref 1.9–3.7)
Glucose, Bld: 101 mg/dL — ABNORMAL HIGH (ref 65–99)
Potassium: 4.7 mmol/L (ref 3.5–5.3)
Sodium: 139 mmol/L (ref 135–146)
Total Bilirubin: 0.7 mg/dL (ref 0.2–1.2)
Total Protein: 6.5 g/dL (ref 6.1–8.1)

## 2024-02-18 LAB — HEMOGLOBIN A1C
Hgb A1c MFr Bld: 5.6 % (ref <5.7)
Mean Plasma Glucose: 114 mg/dL
eAG (mmol/L): 6.3 mmol/L

## 2024-02-18 LAB — CBC WITH DIFFERENTIAL/PLATELET
Absolute Lymphocytes: 1495 {cells}/uL (ref 850–3900)
Absolute Monocytes: 420 {cells}/uL (ref 200–950)
Basophils Absolute: 40 {cells}/uL (ref 0–200)
Basophils Relative: 0.8 %
Eosinophils Absolute: 60 {cells}/uL (ref 15–500)
Eosinophils Relative: 1.2 %
HCT: 44.4 % (ref 38.5–50.0)
Hemoglobin: 14.6 g/dL (ref 13.2–17.1)
MCH: 31.5 pg (ref 27.0–33.0)
MCHC: 32.9 g/dL (ref 32.0–36.0)
MCV: 95.9 fL (ref 80.0–100.0)
MPV: 11.3 fL (ref 7.5–12.5)
Monocytes Relative: 8.4 %
Neutro Abs: 2985 {cells}/uL (ref 1500–7800)
Neutrophils Relative %: 59.7 %
Platelets: 182 Thousand/uL (ref 140–400)
RBC: 4.63 Million/uL (ref 4.20–5.80)
RDW: 12.2 % (ref 11.0–15.0)
Total Lymphocyte: 29.9 %
WBC: 5 Thousand/uL (ref 3.8–10.8)

## 2024-02-18 LAB — LIPID PANEL
Cholesterol: 159 mg/dL (ref <200)
HDL: 63 mg/dL (ref >=40)
LDL Cholesterol (Calc): 76 mg/dL
Non-HDL Cholesterol (Calc): 96 mg/dL (ref <130)
Total CHOL/HDL Ratio: 2.5 (calc) (ref <5.0)
Triglycerides: 112 mg/dL (ref <150)

## 2024-02-18 LAB — PSA: PSA: 1.14 ng/mL (ref <=4.00)

## 2024-02-22 ENCOUNTER — Other Ambulatory Visit: Payer: Self-pay | Admitting: Family Medicine

## 2024-02-22 DIAGNOSIS — E7800 Pure hypercholesterolemia, unspecified: Secondary | ICD-10-CM

## 2024-02-22 DIAGNOSIS — I10 Essential (primary) hypertension: Secondary | ICD-10-CM

## 2024-02-24 ENCOUNTER — Encounter: Payer: Self-pay | Admitting: Family Medicine

## 2024-02-24 ENCOUNTER — Ambulatory Visit (INDEPENDENT_AMBULATORY_CARE_PROVIDER_SITE_OTHER): Admitting: Family Medicine

## 2024-02-24 VITALS — BP 124/72 | HR 72 | Ht 72.0 in | Wt 185.0 lb

## 2024-02-24 DIAGNOSIS — Z23 Encounter for immunization: Secondary | ICD-10-CM

## 2024-02-24 DIAGNOSIS — T7840XS Allergy, unspecified, sequela: Secondary | ICD-10-CM

## 2024-02-24 DIAGNOSIS — E7800 Pure hypercholesterolemia, unspecified: Secondary | ICD-10-CM | POA: Diagnosis not present

## 2024-02-24 DIAGNOSIS — K529 Noninfective gastroenteritis and colitis, unspecified: Secondary | ICD-10-CM

## 2024-02-24 DIAGNOSIS — I10 Essential (primary) hypertension: Secondary | ICD-10-CM | POA: Diagnosis not present

## 2024-02-24 DIAGNOSIS — L509 Urticaria, unspecified: Secondary | ICD-10-CM

## 2024-02-24 DIAGNOSIS — Z91018 Allergy to other foods: Secondary | ICD-10-CM

## 2024-02-24 MED ORDER — EPINEPHRINE 0.3 MG/0.3ML IJ SOAJ: 0.3000 mg | INTRAMUSCULAR | 1 refills | Status: AC | PRN

## 2024-02-24 NOTE — Telephone Encounter (Signed)
 Requested Prescriptions  Pending Prescriptions Disp Refills   amLODipine  (NORVASC ) 5 MG tablet [Pharmacy Med Name: amLODIPine  Besylate 5 MG Oral Tablet] 90 tablet 1    Sig: TAKE 1 TABLET BY MOUTH DAILY     Cardiovascular: Calcium  Channel Blockers 2 Passed - 02/24/2024  4:18 PM      Passed - Last BP in normal range    BP Readings from Last 1 Encounters:  02/24/24 124/72         Passed - Last Heart Rate in normal range    Pulse Readings from Last 1 Encounters:  02/24/24 72         Passed - Valid encounter within last 6 months    Recent Outpatient Visits           Today Essential hypertension   [place] [facility] [place] Marsa PARAS, DO   9 months ago Right lower quadrant abdominal pain   [place] [facility] [place], Marsa PARAS, DO               valsartan  (DIOVAN ) 80 MG tablet [Pharmacy Med Name: Valsartan  80 MG Oral Tablet] 270 tablet 1    Sig: TAKE 3 TABLETS BY MOUTH DAILY     Cardiovascular:  Angiotensin Receptor Blockers Passed - 02/24/2024  4:18 PM      Passed - Cr in normal range and within 180 days    Creat  Date Value Ref Range Status  02/17/2024 0.89 0.70 - 1.35 mg/dL Final         Passed - K in normal range and within 180 days    Potassium  Date Value Ref Range Status  02/17/2024 4.7 3.5 - 5.3 mmol/L Final         Passed - Patient is not pregnant      Passed - Last BP in normal range    BP Readings from Last 1 Encounters:  02/24/24 124/72         Passed - Valid encounter within last 6 months    Recent Outpatient Visits           Today Essential hypertension   [place] [facility] [place] Marsa PARAS, DO   9 months ago Right lower quadrant abdominal pain    [facility] [place], Marsa PARAS, DO               rosuvastatin  (CRESTOR ) 5 MG tablet [Pharmacy Med Name: Rosuvastatin  Calcium  5 MG Oral Tablet] 90 tablet 3    Sig: TAKE 1 TABLET BY  MOUTH AT  BEDTIME     Cardiovascular:  Antilipid - Statins 2 Failed - 02/24/2024  4:18 PM      Failed - Lipid Panel in normal range within the last 12 months    Cholesterol  Date Value Ref Range Status  02/17/2024 159 <200 mg/dL Final   LDL Cholesterol (Calc)  Date Value Ref Range Status  02/17/2024 76 mg/dL (calc) Final    Comment:    Reference range: <100 . Desirable range <100 mg/dL for primary prevention;   <70 mg/dL for patients with CHD or diabetic patients  with > or = 2 CHD risk factors. SABRA LDL-C is now calculated using the Martin-Hopkins  calculation, which is a validated novel method providing  better accuracy than the Friedewald equation in the  estimation of LDL-C.  Gladis APPLETHWAITE et al. SANDREA. 7986;689(80): 2061-2068  (http://education.[employer].com/faq/FAQ164)    HDL  Date Value Ref Range  Status  02/17/2024 63 > OR = 40 mg/dL Final   Triglycerides  Date Value Ref Range Status  02/17/2024 112 <150 mg/dL Final         Passed - Cr in normal range and within 360 days    Creat  Date Value Ref Range Status  02/17/2024 0.89 0.70 - 1.35 mg/dL Final         Passed - Patient is not pregnant      Passed - Valid encounter within last 12 months    Recent Outpatient Visits           Today Essential hypertension   [place] [facility] [place] Marsa PARAS, DO   9 months ago Right lower quadrant abdominal pain   [place] [facility] [place], Marsa PARAS, [place]

## 2024-02-24 NOTE — Progress Notes (Unsigned)
 Subjective:    Patient ID: Lucas Lopez, male    DOB: 1960-10-23, 63 y.o.   MRN: [id number]  Waco Foerster is a 63 y.o. male presenting on 02/24/2024 for Hypertension   HPI  Discussed the use of AI scribe software for clinical note transcription with the patient, who gave verbal consent to proceed.  History of Present Illness   ***Recent pickleball, physical activity and some MSK injuries and started naproxen  temporairly   ***Fear about alpha gal, ***EpiPen   ***Triangular shaped growth in perineum NOdular, scar tissue  ***Loose explosive bowel movement, question if alpha gal  FAA pilot screening exam  ***after   Diverticulitis - resolved Update The patient reported a significant improvement in chronic dark, runny stools following an antibiotic treatment Augmentin  prescribed prior to the colonoscopy. The patient also reported a history of occasional urgency in urination, which has improved with reduced caffeine intake.   Elevated A1c The patient has a family history of diabetes and heart disease, with a mother who died of a heart attack at the age of 58.  Recent lab results showed a slight increase in A1C levels to 5.7, indicating a progression towards prediabetes.  The patient is aware of this and is actively monitoring his weight and diet to manage this risk.   Hyperlipidemia Lab shows LDL 76, stable - On Rosuvastatin  5mg  daily, needs refills   Hypertension Home BP monitoring Controlled on Amlodipine  5mg  daily   The patient also reported occasional use of earplugs due to scooter riding and nighttime use, which has caused some minor discomfort. However, there are no visible issues with the ears.    The patient also reported a past issue with medication side effect and hives, which have since resolved and are no longer a concern.   The patient also uses a nasal spray (mometasone ) without alcohol due to past throat issues. The patient has a prescription for  cromolyn solution oral for occasional inflammation.      Health Maintenance:  Flu Shot today  Prevnar-20 today, age 75+  PSA 1.14 negative.   Colonoscopy 03/20/23 - [facility] had 1 hyperplastic polyp, negative, repeat 5-10 years, noted diverticulosis   Interested in future Pneumonia vaccine in future.   The patient has also received the flu and COVID-19 booster vaccines and is up-to-date with the shingles vaccine. The patient expressed interest in receiving the RSV vaccine, but does not currently meet the criteria for it.     02/24/2024    1:16 PM 10/25/2021    9:16 AM 08/15/2021   10:42 AM  Depression screen PHQ 2/9  Decreased Interest 0 0 0  Down, Depressed, Hopeless 0 0 0  PHQ - 2 Score 0 0 0  Altered sleeping  1 1  Tired, decreased energy  1 1  Change in appetite  1 1  Feeling bad or failure about yourself   0 0  Trouble concentrating  0 0  Moving slowly or fidgety/restless  0 0  Suicidal thoughts  0 0  PHQ-9 Score  3  3   Difficult doing work/chores  Not difficult at all Not difficult at all     Data saved with a previous flowsheet row definition       02/24/2024    1:16 PM 10/25/2021    9:16 AM 08/15/2021   10:43 AM 10/17/2020    1:27 PM  GAD 7 : Generalized Anxiety Score  Nervous, Anxious, on Edge 0 1 1 0  Control/stop worrying 0 0 0  0  Worry too much - different things 0 0 0 0  Trouble relaxing 0 1 1 0  Restless 0 0 0 0  Easily annoyed or irritable 0 1 1 0  Afraid - awful might happen 0 0 0 0  Total GAD 7 Score 0 3 3 0  Anxiety Difficulty  Not difficult at all Not difficult at all Not difficult at all     Past Medical History:  Diagnosis Date   Diverticulitis    Eczema    Gout    In the left most 3 toes of left foot   Hypertension    Vitiligo    History reviewed. No pertinent surgical history. Social History   Socioeconomic History   Marital status: Married    Spouse name: Alhaji Mcneal   Number of children: Not on file   Years of education:  Not on file   Highest education level: Bachelor's degree (e.g., BA, AB, BS)  Occupational History   Occupation: [employer]    Comment: (retiring in Summer 2021)  Tobacco Use   Smoking status: Former    Current packs/day: 1.00    Average packs/day: 1 pack/day for 13.0 years (13.0 ttl pk-yrs)    Types: Cigarettes   Smokeless tobacco: Former  Substance and Sexual Activity   Alcohol use: Not Currently   Drug use: Not Currently    Types: Marijuana   Sexual activity: Not on file  Other Topics Concern   Not on file  Social History Narrative   Not on file   Social Drivers of Health   Financial Resource Strain: Low Risk  (04/29/2023)   Overall Financial Resource Strain (CARDIA)    Difficulty of Paying Living Expenses: Not hard at all  Food Insecurity: No Food Insecurity (04/29/2023)   Hunger Vital Sign    Worried About Running Out of Food in the Last Year: Never true    Ran Out of Food in the Last Year: Never true  Transportation Needs: No Transportation Needs (04/29/2023)   PRAPARE - Administrator, Civil Service (Medical): No    Lack of Transportation (Non-Medical): No  Physical Activity: Sufficiently Active (04/29/2023)   Exercise Vital Sign    Days of Exercise per Week: 3 days    Minutes of Exercise per Session: 100 min  Stress: No Stress Concern Present (04/29/2023)   [employer] of Occupational Health - Occupational Stress Questionnaire    Feeling of Stress : Not at all  Social Connections: Moderately Isolated (04/29/2023)   Social Connection and Isolation Panel    Frequency of Communication with Friends and Family: Never    Frequency of Social Gatherings with Friends and Family: Never    Attends Religious Services: Never    Database Administrator or Organizations: Yes    Attends Engineer, Structural: More than 4 times per year    Marital Status: Married  Catering Manager Violence: Not At Risk (07/06/2019)   Received from [facility]    Humiliation, Afraid, Rape, and Kick questionnaire    Within the last year, have you been afraid of your partner or ex-partner?: No    Within the last year, have you been humiliated or emotionally abused in other ways by your partner or ex-partner?: No    Within the last year, have you been kicked, hit, slapped, or otherwise physically hurt by your partner or ex-partner?: No    Within the last year, have you been raped or forced to have  any kind of sexual activity by your partner or ex-partner?: No   Family History  Problem Relation Age of Onset   Hypertension Mother    Heart attack Mother 30   Fibroids Mother    Pneumonia Father 36   Hyperlipidemia Father    Hypertension Father    Osteoarthritis Father    Psoriasis Sister    Skin cancer Sister    Current Outpatient Medications on File Prior to Visit  Medication Sig   amLODipine  (NORVASC ) 5 MG tablet Take 1 tablet (5 mg total) by mouth daily.   cetirizine  (ZYRTEC ) 10 MG tablet Take 1 tablet (10 mg total) by mouth 2 (two) times daily.   mometasone  (NASONEX ) 50 MCG/ACT nasal spray Use 1 spray in nose per nostril twice a day.   omeprazole  (PRILOSEC) 20 MG capsule Take 1 capsule (20 mg total) by mouth daily as needed.   rosuvastatin  (CRESTOR ) 5 MG tablet Take 1 tablet (5 mg total) by mouth at bedtime.   tadalafil  (CIALIS ) 5 MG tablet Take 1 tablet (5 mg total) by mouth daily.   valsartan  (DIOVAN ) 80 MG tablet TAKE 3 TABLETS BY MOUTH DAILY   No current facility-administered medications on file prior to visit.    Review of Systems Per HPI unless specifically indicated above     Objective:    BP 124/72 (BP Location: Left Arm, Patient Position: Sitting, Cuff Size: Normal)   Pulse 72   Ht 6' (1.829 m)   Wt 185 lb (83.9 kg)   SpO2 96%   BMI 25.09 kg/m   Wt Readings from Last 3 Encounters:  02/24/24 185 lb (83.9 kg)  05/20/23 184 lb (83.5 kg)  04/29/23 191 lb (86.6 kg)    Physical Exam  Results for orders placed or performed in  visit on 02/17/24  PSA   Collection Time: 02/17/24  9:13 AM  Result Value Ref Range   PSA 1.14 < OR = 4.00 ng/mL  CBC with Differential/Platelet   Collection Time: 02/17/24  9:13 AM  Result Value Ref Range   WBC 5.0 3.8 - 10.8 Thousand/uL   RBC 4.63 4.20 - 5.80 Million/uL   Hemoglobin 14.6 13.2 - 17.1 g/dL   HCT 55.5 61.4 - 49.9 %   MCV 95.9 80.0 - 100.0 fL   MCH 31.5 27.0 - 33.0 pg   MCHC 32.9 32.0 - 36.0 g/dL   RDW 87.7 88.9 - 84.9 %   Platelets 182 140 - 400 Thousand/uL   MPV 11.3 7.5 - 12.5 fL   Neutro Abs 2,985 1,500 - 7,800 cells/uL   Absolute Lymphocytes 1,495 850 - 3,900 cells/uL   Absolute Monocytes 420 200 - 950 cells/uL   Eosinophils Absolute 60 15 - 500 cells/uL   Basophils Absolute 40 0 - 200 cells/uL   Neutrophils Relative % 59.7 %   Total Lymphocyte 29.9 %   Monocytes Relative 8.4 %   Eosinophils Relative 1.2 %   Basophils Relative 0.8 %  COMPLETE METABOLIC PANEL WITH GFR   Collection Time: 02/17/24  9:13 AM  Result Value Ref Range   Glucose, Bld 101 (H) 65 - 99 mg/dL   BUN 10 7 - 25 mg/dL   Creat 9.10 9.29 - 8.64 mg/dL   BUN/Creatinine Ratio SEE NOTE: 6 - 22 (calc)   Sodium 139 135 - 146 mmol/L   Potassium 4.7 3.5 - 5.3 mmol/L   Chloride 103 98 - 110 mmol/L   CO2 28 20 - 32 mmol/L   Calcium  9.6 8.6 -  10.3 mg/dL   Total Protein 6.5 6.1 - 8.1 g/dL   Albumin 4.7 3.6 - 5.1 g/dL   Globulin 1.8 (L) 1.9 - 3.7 g/dL (calc)   AG Ratio 2.6 (H) 1.0 - 2.5 (calc)   Total Bilirubin 0.7 0.2 - 1.2 mg/dL   Alkaline phosphatase (APISO) 56 35 - 144 U/L   AST 18 10 - 35 U/L   ALT 22 9 - 46 U/L  Hemoglobin A1c   Collection Time: 02/17/24  9:13 AM  Result Value Ref Range   Hgb A1c MFr Bld 5.6 <5.7 %   Mean Plasma Glucose 114 mg/dL   eAG (mmol/L) 6.3 mmol/L  Lipid panel   Collection Time: 02/17/24  9:13 AM  Result Value Ref Range   Cholesterol 159 <200 mg/dL   HDL 63 > OR = 40 mg/dL   Triglycerides 887 <849 mg/dL   LDL Cholesterol (Calc) 76 mg/dL (calc)   Total  CHOL/HDL Ratio 2.5 <5.0 (calc)   Non-HDL Cholesterol (Calc) 96 <869 mg/dL (calc)      Assessment & Plan:   Problem List Items Addressed This Visit     Essential hypertension - Primary   Relevant Medications   EPINEPHrine  (EPIPEN  2-PAK) 0.3 mg/0.3 mL IJ SOAJ injection   Other Visit Diagnoses       Flu vaccine need       Relevant Orders   Flu vaccine trivalent PF, 6mos and older(Flulaval,Afluria,Fluarix,Fluzone) (Completed)     Need for Streptococcus pneumoniae vaccination       Relevant Orders   Pneumococcal conjugate vaccine 20-valent     Urticarial rash       Relevant Medications   EPINEPHrine  (EPIPEN  2-PAK) 0.3 mg/0.3 mL IJ SOAJ injection     Allergic reaction, sequela       Relevant Medications   EPINEPHrine  (EPIPEN  2-PAK) 0.3 mg/0.3 mL IJ SOAJ injection        Updated Health Maintenance information ***- Reviewed recent lab results with patient Encouraged improvement to lifestyle with diet and exercise -*** Goal of weight loss  Assessment and Plan Assessment & Plan   ***    Orders Placed This Encounter  Procedures   Flu vaccine trivalent PF, 6mos and older(Flulaval,Afluria,Fluarix,Fluzone)   Pneumococcal conjugate vaccine 20-valent    Meds ordered this encounter  Medications   EPINEPHrine  (EPIPEN  2-PAK) 0.3 mg/0.3 mL IJ SOAJ injection    Sig: Inject 0.3 mg into the muscle as needed for anaphylaxis.    Dispense:  1 each    Refill:  1     Follow up plan: Return for 1 year fasting lab > 1 week later Annual Physical.  ***  Marsa Officer, DO [facility] 02/24/2024, 1:23 PM

## 2024-02-24 NOTE — Patient Instructions (Addendum)
 Thank you for coming to the office today.  Flu Shot today  Prevnar-20 today as well, good for 5+ years  EpiPen  ordered to local [facility]  Message me in January or early 2026 when running low and need new orders for [facility], remind me that we will only need refills until November.  Recommend adjust diet as you are planning for more of the exclusion and add back diet and determine if you can pin point an answer, otherwise recommend consultation with GI  [facility] [place]  [address]  [facility] [address] through [address] [place], [place] [address]  [phone]  Georgine Arlean Min, MD  [address]  [id number] [employer]  [place], [place] [address]  [phone] (Work)    DUE for FASTING BLOOD WORK (no food or drink after midnight before the lab appointment, only water or coffee without cream/sugar on the morning of)  SCHEDULE Lab Only visit in the morning at the clinic for lab draw in 1 YEAR  - Make sure Lab Only appointment is at about 1 week before your next appointment, so that results will be available  For Lab Results, once available within 2-3 days of blood draw, you can can log in to MyChart online to view your results and a brief explanation. Also, we can discuss results at next follow-up visit.   Please schedule a Follow-up Appointment to: Return for 1 year fasting lab > 1 week later Annual Physical.  If you have any other questions or concerns, please feel free to call the office or send a message through MyChart. You may also schedule an earlier appointment if necessary.  Additionally, you may be receiving a survey about your experience at our office within a few days to 1 week by e-mail or mail. We value your feedback.  Marsa Officer, DO [facility], [place]

## 2024-03-07 ENCOUNTER — Ambulatory Visit
Admission: EM | Admit: 2024-03-07 | Discharge: 2024-03-07 | Disposition: A | Discharge status: Home / Self Care | Attending: Emergency Medicine | Admitting: Emergency Medicine

## 2024-03-07 DIAGNOSIS — T148XXA Other injury of unspecified body region, initial encounter: Secondary | ICD-10-CM

## 2024-03-07 MED ORDER — BACLOFEN 10 MG PO TABS: 10.0000 mg | ORAL_TABLET | Freq: Three times a day (TID) | ORAL | 0 refills | Status: DC

## 2024-03-07 MED ORDER — IBUPROFEN 600 MG PO TABS: 600.0000 mg | ORAL_TABLET | Freq: Four times a day (QID) | ORAL | 0 refills | Status: AC | PRN

## 2024-03-07 NOTE — Discharge Instructions (Addendum)
 As we discussed you have significant spasm in your external oblique and I believe you have strained it while playing pickle ball.  I recommend resting the area and avoiding any motions that cause fast twitching or other ballistic injury.  You may walk or cycle without difficulty.  Take the 600 mg of ibuprofen every 6 hours with food as needed for pain and inflammation.  Use the baclofen  10 mg every 8 hours for muscle strain and pain.  Apply moist heat to the area for 20 minutes at a time, 2-3 times a day, to help with pain and inflammation.  Follow the following links for exercises to help rehabilitate your oblique muscle groups and improve healing:  https://pbats.com/oblique-injuries-return-to-play/ charmcourses.be  If you do not have any improvement of your symptoms I would recommend following up with orthopedics such as [facility] here in [place] or in [place] as you may need dedicated physical therapy.

## 2024-03-07 NOTE — ED Triage Notes (Signed)
 Patient states that he was paying pickle ball on Nov 20th. Turned the wrong way pulling a muscle. Patient states that the area is getting worse.

## 2024-03-07 NOTE — ED Provider Notes (Signed)
 [facility]    CSN: [id number] Arrival date & time: 03/07/24  0859      History   Chief Complaint Chief Complaint  Patient presents with   Flank Pain    HPI Lucas Lopez is a 63 y.o. male.   HPI  63 year old male with past medical history significant for vitiligo, hypertension, gout, eczema, diverticulitis presents for evaluation of right-sided flank pain.  He reports that the pain began on November 20 and he thinks he may have injured himself while playing pickle ball.  Pain is worse with movement but not with deep breathing.  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 No pertinent surgical history.     Home Medications    Prior to Admission medications   Medication Sig Start Date End Date Taking? Authorizing Provider  amLODipine  (NORVASC ) 5 MG tablet TAKE 1 TABLET BY MOUTH DAILY 02/24/24  Yes Karamalegos, Marsa PARAS, DO  baclofen  (LIORESAL ) 10 MG tablet Take 1 tablet (10 mg total) by mouth 3 (three) times daily. 03/07/24  Yes Bernardino Ditch, NP  ibuprofen (ADVIL) 600 MG tablet Take 1 tablet (600 mg total) by mouth every 6 (six) hours as needed. 03/07/24  Yes Bernardino Ditch, NP  rosuvastatin  (CRESTOR ) 5 MG tablet TAKE 1 TABLET BY MOUTH AT  BEDTIME 02/24/24  Yes Karamalegos, Marsa PARAS, DO  tadalafil  (CIALIS ) 5 MG tablet Take 1 tablet (5 mg total) by mouth  daily. 04/29/23  Yes Karamalegos, Marsa PARAS, DO  valsartan  (DIOVAN ) 80 MG tablet TAKE 3 TABLETS BY MOUTH DAILY 02/24/24  Yes Karamalegos, Marsa PARAS, DO  cetirizine  (ZYRTEC ) 10 MG tablet Take 1 tablet (10 mg total) by mouth 2 (two) times daily. 02/26/23   Karamalegos, Marsa PARAS, DO  EPINEPHrine  (EPIPEN  2-PAK) 0.3 mg/0.3 mL IJ SOAJ injection Inject 0.3 mg into the muscle as needed for anaphylaxis. 02/24/24   Karamalegos, Marsa PARAS, DO  mometasone  (NASONEX ) 50 MCG/ACT nasal spray Use 1 spray in nose per nostril twice a day. 11/10/23   Karamalegos, Marsa PARAS, DO  omeprazole  (PRILOSEC) 20 MG capsule Take 1 capsule (20 mg total) by mouth daily as needed. 08/15/21   Karamalegos, Marsa PARAS, DO    Family History Family History  Problem Relation Age of Onset   Hypertension Mother    Heart attack Mother 22   Fibroids Mother    Pneumonia Father 61   Hyperlipidemia Father    Hypertension Father    Osteoarthritis Father    Psoriasis Sister    Skin cancer Sister     Social History Social History   Tobacco Use   Smoking status: Former    Current packs/day: 1.00    Average packs/day: 1 pack/day for 13.0 years (13.0 ttl pk-yrs)    Types: Cigarettes   Smokeless tobacco: Former  Substance Use Topics   Alcohol use: Not Currently  Drug use: Not Currently    Types: Marijuana     Allergies   Alpha-gal   Review of Systems Review of Systems  Constitutional:  Negative for fever.  Genitourinary:  Positive for flank pain.     Physical Exam Triage Vital Signs ED Triage Vitals  Encounter Vitals Group     BP      Girls Systolic BP Percentile      Girls Diastolic BP Percentile      Boys Systolic BP Percentile      Boys Diastolic BP Percentile      Pulse      Resp      Temp      Temp src      SpO2      Weight      Height      Head Circumference      Peak Flow      Pain Score      Pain Loc      Pain Education      Exclude from Growth Chart    No data found.  Updated  Vital Signs BP 134/77 (BP Location: Right Arm)   Pulse 61   Temp 98.1 F (36.7 C) (Oral)   Resp 17   Wt 185 lb (83.9 kg)   SpO2 95%   BMI 25.09 kg/m   Visual Acuity Right Eye Distance:   Left Eye Distance:   Bilateral Distance:    Right Eye Near:   Left Eye Near:    Bilateral Near:     Physical Exam Vitals and nursing note reviewed.  Constitutional:      Appearance: Normal appearance. He is not ill-appearing.  HENT:     Head: Normocephalic and atraumatic.  Musculoskeletal:        General: Tenderness present.  Skin:    General: Skin is warm and dry.     Capillary Refill: Capillary refill takes less than 2 seconds.     Findings: No bruising or erythema.  Neurological:     General: No focal deficit present.     Mental Status: He is alert and oriented to person, place, and time.      UC Treatments / Results  Labs (all labs ordered are listed, but only abnormal results are displayed) Labs Reviewed - No data to display  EKG   Radiology No results found.  Procedures Procedures (including critical care time)  Medications Ordered in UC Medications - No data to display  Initial Impression / Assessment and Plan / UC Course  I have reviewed the triage vital signs and the nursing notes.  Pertinent labs & imaging results that were available during my care of the patient were reviewed by me and considered in my medical decision making (see chart for details).   Patient is a pleasant, nontoxic-appearing 63 year old male presenting for evaluation of right flank pain that started 10 days ago after playing pickle ball.  He reports that since then he has been experiencing pain with movement and in particular if he lays on his left side he feels pain on the right where the injury was.  The pain is better if he lays on his right side.  It does not decrease with deep breathing.  He thinks he may have injured himself playing pickle ball but he cannot remember any discrete injury.   He has had several instances of silent rib fractures and is concerned he may have fractured another rib.  He is not experiencing any dyspnea  or shortness of breath.  On exam, the patient's area of tenderness is below the costal margin on the right and it is in his right external oblique.  There is significant spasm present.  No overlying erythema or ecchymosis.  I will treat him for oblique muscle spasm with 600 mg ibuprofen every 6 hours along with 10 mg of baclofen  every 8 hours.  We discussed using moist heat to improve blood flow to the muscle to help aid in healing.  I also advised him to increase his oral fluid intake so that he improves his hydration and helps his body flush the lactic acid from his system.  Review of labs in epic indicate normal renal function from his CMP dated 02/17/2024.   Final Clinical Impressions(s) / UC Diagnoses   Final diagnoses:  Muscle strain     Discharge Instructions      As we discussed you have significant spasm in your external oblique and I believe you have strained it while playing pickle ball.  I recommend resting the area and avoiding any motions that cause fast twitching or other ballistic injury.  You may walk or cycle without difficulty.  Take the 600 mg of ibuprofen every 6 hours with food as needed for pain and inflammation.  Use the baclofen  10 mg every 8 hours for muscle strain and pain.  Apply moist heat to the area for 20 minutes at a time, 2-3 times a day, to help with pain and inflammation.  Follow the following links for exercises to help rehabilitate your oblique muscle groups and improve healing:  https://pbats.com/oblique-injuries-return-to-play/ charmcourses.be  If you do not have any improvement of your symptoms I would recommend following up with orthopedics such as [facility] here in [place] or in [place] as you may need dedicated physical therapy.     ED Prescriptions     Medication  Sig Dispense Auth. Provider   ibuprofen (ADVIL) 600 MG tablet Take 1 tablet (600 mg total) by mouth every 6 (six) hours as needed. 30 tablet Bernardino Ditch, NP   baclofen  (LIORESAL ) 10 MG tablet Take 1 tablet (10 mg total) by mouth 3 (three) times daily. 30 each Bernardino Ditch, NP      PDMP not reviewed this encounter.   Bernardino Ditch, NP 03/07/24 [phone]

## 2024-04-06 ENCOUNTER — Ambulatory Visit
Admission: RE | Admit: 2024-04-06 | Discharge: 2024-04-06 | Disposition: A | Discharge status: Home / Self Care | Attending: Family Medicine | Admitting: Family Medicine

## 2024-04-06 ENCOUNTER — Ambulatory Visit
Admission: RE | Admit: 2024-04-06 | Discharge: 2024-04-06 | Disposition: A | Discharge status: Home / Self Care | Source: Ambulatory Visit | Attending: Family Medicine | Admitting: Family Medicine

## 2024-04-06 ENCOUNTER — Ambulatory Visit: Payer: Self-pay | Admitting: Family Medicine

## 2024-04-06 ENCOUNTER — Ambulatory Visit (INDEPENDENT_AMBULATORY_CARE_PROVIDER_SITE_OTHER): Admitting: Family Medicine

## 2024-04-06 ENCOUNTER — Encounter: Payer: Self-pay | Admitting: Family Medicine

## 2024-04-06 VITALS — BP 124/78 | HR 82 | Ht 72.0 in | Wt 189.0 lb

## 2024-04-06 DIAGNOSIS — M7742 Metatarsalgia, left foot: Secondary | ICD-10-CM

## 2024-04-06 DIAGNOSIS — M2141 Flat foot [pes planus] (acquired), right foot: Secondary | ICD-10-CM

## 2024-04-06 DIAGNOSIS — M79672 Pain in left foot: Secondary | ICD-10-CM

## 2024-04-06 DIAGNOSIS — M2142 Flat foot [pes planus] (acquired), left foot: Secondary | ICD-10-CM | POA: Diagnosis not present

## 2024-04-06 NOTE — Progress Notes (Signed)
 "  Subjective:    Patient ID: Lucas Lopez, male    DOB: 23-Oct-1960, 64 y.o.   MRN: [id number]  Lucas Lopez is a 64 y.o. male presenting on 04/06/2024 for Foot Injury (Left couple weeks swelling )  Patient presents for a same day appointment.   HPI  Discussed the use of AI scribe software for clinical note transcription with the patient, who gave verbal consent to proceed.  History of Present Illness   Lucas Lopez is a 63 year old male who presents with left foot pain and swelling.  Left foot pain and swelling - Intermittent needle-like pain localized to the metatarsal area between the ankle and toes, primarily on the dorsal aspect of the foot - Pain worsens with activity such as walking or playing pickleball and leads to limping - Pain has persisted for at least three weeks, with fluctuations in intensity depending on activity level - Swelling of the left foot persists despite some improvement in pain with reduced activity over the past week - Concern for possible stress fracture or significant injury due to persistent swelling and pain  History of pes planus (flat feet) and orthotic use - Diagnosed with flat feet 40 years ago - Uses old orthotics with a lifetime warranty  Analgesic use - Uses naproxen  for pain management, which is effective - Used naproxen  consistently for two weeks in the past, but not recently     02/24/2024    1:16 PM 10/25/2021    9:16 AM 08/15/2021   10:42 AM  Depression screen PHQ 2/9  Decreased Interest 0 0 0  Down, Depressed, Hopeless 0 0 0  PHQ - 2 Score 0 0 0  Altered sleeping  1 1  Tired, decreased energy  1 1  Change in appetite  1 1  Feeling bad or failure about yourself   0 0  Trouble concentrating  0 0  Moving slowly or fidgety/restless  0 0  Suicidal thoughts  0 0  PHQ-9 Score  3  3   Difficult doing work/chores Not difficult at all Not difficult at all Not difficult at all     Data saved with a previous flowsheet row  definition       02/24/2024    1:16 PM 10/25/2021    9:16 AM 08/15/2021   10:43 AM 10/17/2020    1:27 PM  GAD 7 : Generalized Anxiety Score  Nervous, Anxious, on Edge 0 1 1 0  Control/stop worrying 0 0 0 0  Worry too much - different things 0 0 0 0  Trouble relaxing 0 1 1 0  Restless 0 0 0 0  Easily annoyed or irritable 0 1 1 0  Afraid - awful might happen 0 0 0 0  Total GAD 7 Score 0 3 3 0  Anxiety Difficulty  Not difficult at all Not difficult at all Not difficult at all    Social History[1]  Review of Systems Per HPI unless specifically indicated above     Objective:    BP 124/78 (BP Location: Left Arm, Patient Position: Sitting, Cuff Size: Normal)   Pulse 82   Ht 6' (1.829 m)   Wt 189 lb (85.7 kg)   SpO2 98%   BMI 25.63 kg/m   Wt Readings from Last 3 Encounters:  04/06/24 189 lb (85.7 kg)  03/07/24 185 lb (83.9 kg)  02/24/24 185 lb (83.9 kg)    Physical Exam Vitals and nursing note reviewed.  Constitutional:  General: He is not in acute distress.    Appearance: Normal appearance. He is well-developed. He is not diaphoretic.     Comments: Well-appearing, comfortable, cooperative  HENT:     Head: Normocephalic and atraumatic.  Eyes:     General:        Right eye: No discharge.        Left eye: No discharge.     Conjunctiva/sclera: Conjunctivae normal.  Cardiovascular:     Rate and Rhythm: Normal rate.  Pulmonary:     Effort: Pulmonary effort is normal.  Musculoskeletal:     Comments: Left Foot with mild slight swelling or edematous appearance, it is very mild and no erythema. Mild bony tenderness L MTP dorsal surface and some discomfort plantar. Standing loss of medial longitudinal arch  Skin:    General: Skin is warm and dry.     Findings: No erythema or rash.  Neurological:     Mental Status: He is alert and oriented to person, place, and time.  Psychiatric:        Mood and Affect: Mood normal.        Behavior: Behavior normal.        Thought  Content: Thought content normal.     Comments: Well groomed, good eye contact, normal speech and thoughts     I have personally reviewed the radiology report from 04/06/24 on L Foot X-ray.  Narrative & Impression  CLINICAL DATA:  Left foot pain after injury 3 weeks ago   EXAM: LEFT FOOT - COMPLETE 3+ VIEW   COMPARISON:  None Available.   FINDINGS: There is no evidence of fracture or dislocation. There is no evidence of arthropathy or other focal bone abnormality. Soft tissues are unremarkable.   IMPRESSION: Negative.     Electronically Signed   By: Lynwood Landy Raddle M.D.   On: 04/06/2024 12:03    Results for orders placed or performed in visit on 02/17/24  PSA   Collection Time: 02/17/24  9:13 AM  Result Value Ref Range   PSA 1.14 < OR = 4.00 ng/mL  CBC with Differential/Platelet   Collection Time: 02/17/24  9:13 AM  Result Value Ref Range   WBC 5.0 3.8 - 10.8 Thousand/uL   RBC 4.63 4.20 - 5.80 Million/uL   Hemoglobin 14.6 13.2 - 17.1 g/dL   HCT 55.5 61.4 - 49.9 %   MCV 95.9 80.0 - 100.0 fL   MCH 31.5 27.0 - 33.0 pg   MCHC 32.9 32.0 - 36.0 g/dL   RDW 87.7 88.9 - 84.9 %   Platelets 182 140 - 400 Thousand/uL   MPV 11.3 7.5 - 12.5 fL   Neutro Abs 2,985 1,500 - 7,800 cells/uL   Absolute Lymphocytes 1,495 850 - 3,900 cells/uL   Absolute Monocytes 420 200 - 950 cells/uL   Eosinophils Absolute 60 15 - 500 cells/uL   Basophils Absolute 40 0 - 200 cells/uL   Neutrophils Relative % 59.7 %   Total Lymphocyte 29.9 %   Monocytes Relative 8.4 %   Eosinophils Relative 1.2 %   Basophils Relative 0.8 %  COMPLETE METABOLIC PANEL WITH GFR   Collection Time: 02/17/24  9:13 AM  Result Value Ref Range   Glucose, Bld 101 (H) 65 - 99 mg/dL   BUN 10 7 - 25 mg/dL   Creat 9.10 9.29 - 8.64 mg/dL   BUN/Creatinine Ratio SEE NOTE: 6 - 22 (calc)   Sodium 139 135 - 146 mmol/L   Potassium 4.7 3.5 -  5.3 mmol/L   Chloride 103 98 - 110 mmol/L   CO2 28 20 - 32 mmol/L   Calcium  9.6 8.6 -  10.3 mg/dL   Total Protein 6.5 6.1 - 8.1 g/dL   Albumin 4.7 3.6 - 5.1 g/dL   Globulin 1.8 (L) 1.9 - 3.7 g/dL (calc)   AG Ratio 2.6 (H) 1.0 - 2.5 (calc)   Total Bilirubin 0.7 0.2 - 1.2 mg/dL   Alkaline phosphatase (APISO) 56 35 - 144 U/L   AST 18 10 - 35 U/L   ALT 22 9 - 46 U/L  Hemoglobin A1c   Collection Time: 02/17/24  9:13 AM  Result Value Ref Range   Hgb A1c MFr Bld 5.6 <5.7 %   Mean Plasma Glucose 114 mg/dL   eAG (mmol/L) 6.3 mmol/L  Lipid panel   Collection Time: 02/17/24  9:13 AM  Result Value Ref Range   Cholesterol 159 <200 mg/dL   HDL 63 > OR = 40 mg/dL   Triglycerides 887 <849 mg/dL   LDL Cholesterol (Calc) 76 mg/dL (calc)   Total CHOL/HDL Ratio 2.5 <5.0 (calc)   Non-HDL Cholesterol (Calc) 96 <869 mg/dL (calc)      Assessment & Plan:   Problem List Items Addressed This Visit   None Visit Diagnoses       Left foot pain    -  Primary   Relevant Orders   DG Foot Complete Left (Completed)     Metatarsalgia of left foot       Relevant Orders   DG Foot Complete Left (Completed)     Pes planus of both feet            Metatarsalgia of left foot Intermittent needle-like pain on dorsum of left foot, exacerbated by activity, improved with rest. Differential includes stress fracture, cartilage or tendon damage. X-ray needed to rule out stress fracture. Explained potential for occult stress fracture and importance of diagnosis due to active lifestyle and sports like pickleball or high impact activities  - Ordered x-ray of left foot to rule out stress fracture. - Recommended metatarsal pads for shoe support. - Advised use of naproxen  for 1-2 weeks as needed. - Instructed to avoid high-impact activities until cleared by x-ray results. - If x-ray is negative, focus on metatarsal support and avoid high-impact activities. - If symptoms persist after three weeks, consider referral to podiatry.  Flexible flatfoot, left foot Pes planus with loss of medial longitudinal arch  contributing to metatarsalgia. Discussed role of arch support and metatarsal pads in managing symptoms. - Recommended metatarsal pads for targeted support. - Discussed potential for custom orthotics through podiatry if needed.      Results on X-ray Negative. No stress fracture or abnormality. Sent to [facility] on current plan if limited success with OTC metatarsal pads 3 more week we can refer to Podiatry   Orders Placed This Encounter  Procedures   DG Foot Complete Left    Standing Status:   Future    Number of Occurrences:   1    Expiration Date:   04/06/2025    Reason for Exam (SYMPTOM  OR DIAGNOSIS REQUIRED):   left foot metatarsalgia point tender over 1st metatarsal pickleball injury 3 weeks rule out stress fracture    Preferred imaging location?:   [facility]    No orders of the defined types were placed in this encounter.   Follow up plan: Return if symptoms worsen or fail to improve.  Marsa Officer, DO [facility] 04/06/2024, 9:16 AM     [1]  Social History Tobacco Use   Smoking status: Former    Current packs/day: 1.00    Average packs/day: 1 pack/day for 13.0 years (13.0 ttl pk-yrs)    Types: Cigarettes   Smokeless tobacco: Former  Substance Use Topics   Alcohol use: Not Currently   Drug use: Not Currently    Types: Marijuana   "

## 2024-04-06 NOTE — Patient Instructions (Addendum)
 Thank you for coming to the office today.  [facility] for Imaging - X-ray Left Foot.  Likely diagnosis Metatarsalgia  Recommend Metatarsal Pad for the shoe / insert to provide support  Arch supports are useful but may not directly address the part of the arch we need.  If X-ray is negative, focus on the metatarsal area and avoid high impact if possible to allow it to heal  Okay to use anti inflammatories for 1-2 weeks at a time.  If still not results in another 3 weeks, we can refer to Podiatry for another opinion.  Please schedule a Follow-up Appointment to: Return if symptoms worsen or fail to improve.  If you have any other questions or concerns, please feel free to call the office or send a message through MyChart. You may also schedule an earlier appointment if necessary.  Additionally, you may be receiving a survey about your experience at our office within a few days to 1 week by e-mail or mail. We value your feedback.  Marsa Officer, DO [facility], [place]

## 2024-04-28 ENCOUNTER — Other Ambulatory Visit: Payer: Self-pay

## 2024-04-29 ENCOUNTER — Encounter: Payer: Self-pay | Admitting: Podiatry

## 2024-04-29 ENCOUNTER — Ambulatory Visit (INDEPENDENT_AMBULATORY_CARE_PROVIDER_SITE_OTHER): Payer: Self-pay | Admitting: Podiatry

## 2024-04-29 DIAGNOSIS — M21961 Unspecified acquired deformity of right lower leg: Secondary | ICD-10-CM | POA: Diagnosis not present

## 2024-04-29 DIAGNOSIS — M21962 Unspecified acquired deformity of left lower leg: Secondary | ICD-10-CM | POA: Diagnosis not present

## 2024-04-29 DIAGNOSIS — M76822 Posterior tibial tendinitis, left leg: Secondary | ICD-10-CM

## 2024-04-29 NOTE — Progress Notes (Signed)
 "  Subjective:  Patient ID: Lucas Lopez, male    DOB: 10/04/1960,  MRN: [id number]  Chief Complaint  Patient presents with   Foot Pain    64 y.o. male presents with the above complaint.  Patient presents with complaint of left medial foot pain that has been going for quite some time he states it hurts with ambulation or shoe pressure pain scale 7 out of 10 dull aching nature does not wear any orthotics or regular shoes.  He would like to discuss treatment options for this.   Review of Systems: Negative except as noted in the HPI. Denies N/V/F/Ch.  Past Medical History:  Diagnosis Date   Diverticulitis    Eczema    Gout    In the left most 3 toes of left foot   Hypertension    Vitiligo    Current Medications[1]  Tobacco Use History[2]  Allergies[3] Objective:  There were no vitals filed for this visit. There is no height or weight on file to calculate BMI. Constitutional Well developed. Well nourished.  Vascular Dorsalis pedis pulses palpable bilaterally. Posterior tibial pulses palpable bilaterally. Capillary refill normal to all digits.  No cyanosis or clubbing noted. Pedal hair growth normal.  Neurologic Normal speech. Oriented to person, place, and time. Epicritic sensation to light touch grossly present bilaterally.  Dermatologic Nails well groomed and normal in appearance. No open wounds. No skin lesions.  Orthopedic: Pain on palpation along the course of the posterior tibial tendon no pain at the Achilles tendon peroneal tendon ATFL ligament.  Pain with resisted plantarflexion and eversion of the foot.  Pes planovalgus foot structure noted.   Radiographs: None Assessment:   1. Posterior tibial tendinitis of left leg   2. Foot deformity, bilateral    Plan:  Patient was evaluated and treated and all questions answered.  Left posterior tibial tendinitis - All questions and concerns were discussed with the patient in extensive detail given the amount of  pain that he is having a benefit from cam boot immobilization to help decrease inflammatory complaints of joint pain.  Patient agrees with plan to proceed with cam boot immobilization - Cam boot was dispensed  Pes planovalgus/foot deformity -I explained to patient the etiology of pes planovalgus and relationship with heel pain/arch pain and various treatment options were discussed.  Given patient foot structure in the setting of heel pain/arch pain I believe patient will benefit from custom-made orthotics to help control the hindfoot motion support the arch of the foot and take the stress away from arches.  Patient agrees with the plan like to proceed with orthotics -Patient was casted for orthotics     [1]  Current Outpatient Medications:    amLODipine  (NORVASC ) 5 MG tablet, TAKE 1 TABLET BY MOUTH DAILY, Disp: 90 tablet, Rfl: 1   cetirizine  (ZYRTEC ) 10 MG tablet, Take 1 tablet (10 mg total) by mouth 2 (two) times daily., Disp: 180 tablet, Rfl: 3   EPINEPHrine  (EPIPEN  2-PAK) 0.3 mg/0.3 mL IJ SOAJ injection, Inject 0.3 mg into the muscle as needed for anaphylaxis., Disp: 1 each, Rfl: 1   ibuprofen  (ADVIL ) 600 MG tablet, Take 1 tablet (600 mg total) by mouth every 6 (six) hours as needed., Disp: 30 tablet, Rfl: 0   mometasone  (NASONEX ) 50 MCG/ACT nasal spray, Use 1 spray in nose per nostril twice a day., Disp: 51 each, Rfl: 3   omeprazole  (PRILOSEC) 20 MG capsule, Take 1 capsule (20 mg total) by mouth daily as needed., Disp: 30  capsule, Rfl: 1   rosuvastatin  (CRESTOR ) 5 MG tablet, TAKE 1 TABLET BY MOUTH AT  BEDTIME, Disp: 90 tablet, Rfl: 3   tadalafil  (CIALIS ) 5 MG tablet, Take 1 tablet (5 mg total) by mouth daily., Disp: 90 tablet, Rfl: 3   valsartan  (DIOVAN ) 80 MG tablet, TAKE 3 TABLETS BY MOUTH DAILY, Disp: 270 tablet, Rfl: 1 [2]  Social History Tobacco Use  Smoking Status Former   Current packs/day: 1.00   Average packs/day: 1 pack/day for 13.0 years (13.0 ttl pk-yrs)   Types: Cigarettes   Smokeless Tobacco Former  [3]  Allergies Allergen Reactions   Alpha-Gal Diarrhea, Hives, Itching, Rash and Swelling   "

## 2024-05-05 ENCOUNTER — Ambulatory Visit: Payer: Self-pay | Admitting: Family Medicine

## 2024-05-27 ENCOUNTER — Ambulatory Visit: Admitting: Podiatry

## 2025-02-16 ENCOUNTER — Other Ambulatory Visit

## 2025-02-23 ENCOUNTER — Encounter: Admitting: Family Medicine
